# Patient Record
Sex: Male | Born: 1970 | Race: White | Hispanic: No | Marital: Single
Health system: Southern US, Community
[De-identification: ages and names within clinical notes are randomized; demographics above are authoritative.]

---

## 2018-04-28 ENCOUNTER — Emergency Department (HOSPITAL_COMMUNITY): Payer: Self-pay

## 2018-04-28 ENCOUNTER — Emergency Department (HOSPITAL_COMMUNITY)
Admission: EM | Admit: 2018-04-28 | Discharge: 2018-04-28 | Disposition: A | Discharge status: Home / Self Care | Payer: Self-pay | Attending: Emergency Medicine | Admitting: Emergency Medicine

## 2018-04-28 ENCOUNTER — Encounter (HOSPITAL_COMMUNITY): Payer: Self-pay

## 2018-04-28 ENCOUNTER — Other Ambulatory Visit: Payer: Self-pay

## 2018-04-28 DIAGNOSIS — Y999 Unspecified external cause status: Secondary | ICD-10-CM | POA: Insufficient documentation

## 2018-04-28 DIAGNOSIS — W268XXA Contact with other sharp object(s), not elsewhere classified, initial encounter: Secondary | ICD-10-CM | POA: Insufficient documentation

## 2018-04-28 DIAGNOSIS — S66323A Laceration of extensor muscle, fascia and tendon of left middle finger at wrist and hand level, initial encounter: Secondary | ICD-10-CM | POA: Insufficient documentation

## 2018-04-28 DIAGNOSIS — Y9389 Activity, other specified: Secondary | ICD-10-CM | POA: Insufficient documentation

## 2018-04-28 DIAGNOSIS — Y929 Unspecified place or not applicable: Secondary | ICD-10-CM | POA: Insufficient documentation

## 2018-04-28 MED ORDER — ONDANSETRON HCL 4 MG PO TABS: 4.0000 mg | ORAL_TABLET | Freq: Four times a day (QID) | ORAL | 0 refills | Status: DC

## 2018-04-28 MED ORDER — HYDROCODONE-ACETAMINOPHEN 5-325 MG PO TABS: 1.0000 | ORAL_TABLET | ORAL | 0 refills | Status: DC | PRN

## 2018-04-28 MED ORDER — POVIDONE-IODINE 10 % EX SOLN
CUTANEOUS | Status: AC
  Administered 2018-04-28: 19:00:00
  Filled 2018-04-28: qty 15

## 2018-04-28 MED ORDER — LIDOCAINE HCL (PF) 1 % IJ SOLN
5.0000 mL | Freq: Once | INTRAMUSCULAR | Status: AC
  Administered 2018-04-28: 5 mL
  Filled 2018-04-28: qty 6

## 2018-04-28 NOTE — Discharge Instructions (Signed)
You have a laceration of the left middle finger area that involves a tendon which helps with movement of your finger.  The laceration has been closed loosely, and the wound has been washed out.  Please see Dr. [name], or the hand specialist of your choice concerning this tendon laceration.  Please keep the splint on until you are seen by hand specialist.  Please keep the splint clean and dry.  Use Tylenol every 4 hours, or ibuprofen every 6 hours for pain and discomfort.  You may use Norco for more severe pain.  You also have a prescription for Zofran in the event that the narcotic medication causes nausea/vomiting.

## 2018-04-28 NOTE — ED Provider Notes (Signed)
[facility] EMERGENCY DEPARTMENT Provider Note   CSN: [id number] Arrival date & time: 04/28/18  1634     History   Chief Complaint Chief Complaint  Patient presents with  . Laceration    HPI Aaron Webster is a 47 y.o. male.  Patient is a 47 year old male who presents to the emergency department with a laceration to the left hand.  The patient states that he was working on some sheet metal and he sustained a laceration to the back of his hand at the area of the middle finger.  Patient states that since that time he can bend his all of his fingers, but he cannot lift the middle finger.  The patient states that his tetanus is up-to-date.  The patient denies being on any anticoagulation medications.  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  He presents to the emergency department now for assistance with this issue.  No other lacerations reported.  The history is provided by the patient.  Laceration      History reviewed. No pertinent past medical history.  There are no active problems to display for this patient.   History reviewed. No pertinent surgical history.      Home Medications    Prior to Admission medications   Not on File    Family History No family history on file.  Social History Social History   Tobacco Use  . Smoking status: Never Smoker  . Smokeless tobacco: Never Used  Substance Use Topics  . Alcohol use: Never    Frequency: Never  . Drug use: Never     Allergies   Codeine   Review of Systems Review of Systems  Constitutional: Negative for activity change.       All ROS Neg except as noted in HPI  HENT: Negative for nosebleeds.   Eyes: Negative for photophobia and discharge.  Respiratory: Negative for cough, shortness of breath and wheezing.   Cardiovascular: Negative for chest pain and palpitations.  Gastrointestinal: Negative for abdominal pain and blood in stool.  Genitourinary: Negative for dysuria, frequency and hematuria.    Musculoskeletal: Negative for arthralgias, back pain and neck pain.  Skin: Negative.   Neurological: Negative for dizziness, seizures and speech difficulty.  Psychiatric/Behavioral: Negative for confusion and hallucinations.     Physical Exam Updated Vital Signs BP 139/85 (BP Location: Right Arm)   Pulse (!) 55   Temp 98 F (36.7 C) (Oral)   Resp 17   Ht  (1.905 m)   Wt 97.5 kg (215 lb)   SpO2 99%   BMI 26.87 kg/m   Physical Exam  Constitutional: He is oriented to person, place, and time. He appears well-developed and well-nourished.  Non-toxic appearance.  HENT:  Head: Normocephalic.  Right Ear: Tympanic membrane and external ear normal.  Left Ear: Tympanic membrane and external ear normal.  Eyes: Pupils are equal, round, and reactive to light. EOM and lids are normal.  Neck: Normal range of motion. Neck supple. Carotid bruit is not present.  Cardiovascular: Normal rate, regular rhythm, normal heart sounds, intact distal pulses and normal pulses.  Pulmonary/Chest: Breath sounds normal. No respiratory distress.  Abdominal: Soft. Bowel sounds are normal. There is no tenderness. There is no guarding.  Musculoskeletal:       Left hand: He exhibits decreased range of motion, tenderness and laceration. Normal sensation noted. Decreased strength noted.       Hands: Pt cannot extend the left middle finger.  1.2 cm laceration  of the dorsum of the MP joint of the left middle finger.  Capillary refill is less than 2 seconds.  Radial pulses 2+.  No temperature changes of any of the fingers of the left upper extremity.  No injury to the right upper extremity.  Lymphadenopathy:       Head (right side): No submandibular adenopathy present.       Head (left side): No submandibular adenopathy present.    He has no cervical adenopathy.  Neurological: He is alert and oriented to person, place, and time. He has normal strength. No cranial nerve deficit or sensory deficit.  Skin: Skin is  warm and dry.  Psychiatric: He has a normal mood and affect. His speech is normal.  Nursing note and vitals reviewed.    ED Treatments / Results  Labs (all labs ordered are listed, but only abnormal results are displayed) Labs Reviewed - No data to display  EKG None  Radiology Dg Hand Complete Left  Result Date: 04/28/2018 CLINICAL DATA:  Trauma with laceration of middle finger. EXAM: LEFT HAND - COMPLETE 3+ VIEW COMPARISON:  None. FINDINGS: No acute fracture or dislocation. Soft tissue injury about the dorsum of the hand at the level of the metacarpals. No radiopaque foreign object. Soft tissue swelling about the level of the metacarpal phalangeal joints. IMPRESSION: Soft tissue injury, without acute osseous abnormality. Electronically Signed   By: Jeronimo Greaves M.D.   On: 04/28/2018 17:28    Procedures .Marland KitchenLaceration Repair Date/Time: 04/28/2018 6:58 PM Performed by: Ivery Quale, PA-C Authorized by: Ivery Quale, PA-C   Consent:    Consent obtained:  Verbal   Consent given by:  Patient   Risks discussed:  Infection, pain and poor wound healing Anesthesia (see MAR for exact dosages):    Anesthesia method:  Local infiltration   Local anesthetic:  Lidocaine 1% w/o epi Laceration details:    Location:  Hand   Hand location:  L hand, dorsum   Length (cm):  1.2 Repair type:    Repair type:  Simple Pre-procedure details:    Preparation:  Patient was prepped and draped in usual sterile fashion Exploration:    Hemostasis achieved with:  Direct pressure   Wound exploration: wound explored through full range of motion and entire depth of wound probed and visualized     Wound extent: tendon damage     Wound extent: no foreign bodies/material noted and no underlying fracture noted     Tendon damage location:  Upper extremity   Upper extremity tendon damage location:  Finger extensor   Tendon damage extent:  Complete transection   Tendon repair plan:  Refer for  evaluation Treatment:    Area cleansed with:  Betadine   Amount of cleaning:  Standard   Irrigation solution:  Sterile saline Skin repair:    Repair method:  Sutures   Suture size:  4-0   Suture material:  Nylon   Suture technique:  Simple interrupted   Number of sutures:  3 Approximation:    Approximation:  Loose Post-procedure details:    Dressing:  Splint for protection, antibiotic ointment and non-adherent dressing   Patient tolerance of procedure:  Tolerated well, no immediate complications .Splint Application Date/Time: 04/28/2018 7:05 PM Performed by: Ivery Quale, PA-C Authorized by: Ivery Quale, PA-C   Consent:    Consent obtained:  Verbal   Consent given by:  Patient   Risks discussed:  Numbness, pain and swelling Universal protocol:    Procedure explained and questions  answered to patient or proxy's satisfaction: yes     Test results available and properly labeled: yes     Immediately prior to procedure a time out was called: yes     Patient identity confirmed:  Arm band Pre-procedure details:    Sensation:  Normal   Skin color:  Normal Procedure details:    Laterality:  Left   Location:  Hand   Hand:  L hand   Splint type:  Wrist   Supplies:  Ortho-Glass Post-procedure details:    Pain:  Unchanged   Sensation:  Normal   Skin color:  Normal   Patient tolerance of procedure:  Tolerated well, no immediate complications   (including critical care time)  Medications Ordered in ED Medications  povidone-iodine (BETADINE) 10 % external solution (has no administration in time range)  lidocaine (PF) (XYLOCAINE) 1 % injection 5 mL (has no administration in time range)     Initial Impression / Assessment and Plan / ED Course  I have reviewed the triage vital signs and the nursing notes.  Pertinent labs & imaging results that were available during my care of the patient were reviewed by me and considered in my medical decision making (see chart for  details).      Final Clinical Impressions(s) / ED Diagnoses MDM  Patient noted to have a laceration to the dorsum of the left hand at the MP joint.  Further examination reveals a tendon injury, with inability of the patient to extend the middle finger of the left hand.  The wound was closed loosely.  A splint was applied.  The patient was given medication to assist with pain.  Patient is referred to Dr. Omer Jack surgery.  Patient was advised to return if any signs of advancing infection or other problems before he is seen by Dr. [name] or a member of his team.  Patient is in agreement with this plan.   Final diagnoses:  Laceration of extensor muscle, fascia and tendon of left middle finger at wrist and hand level, initial encounter    ED Discharge Orders        Ordered    HYDROcodone-acetaminophen (NORCO/VICODIN) 5-325 MG tablet  Every 4 hours PRN     04/28/18 1850    ondansetron (ZOFRAN) 4 MG tablet  Every 6 hours     04/28/18 1850       Ivery Quale, PA-C 04/28/18 1910    Loren Racer, MD 05/01/18 [phone]

## 2018-04-28 NOTE — ED Triage Notes (Signed)
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 Needs tetanus shot.

## 2018-05-01 ENCOUNTER — Ambulatory Visit: Admit: 2018-05-01 | Payer: Self-pay | Admitting: Orthopedic Surgery

## 2018-05-01 SURGERY — IRRIGATION AND DEBRIDEMENT EXTREMITY
Anesthesia: General | Laterality: Left

## 2019-12-11 HISTORY — PX: HAND SURGERY: SHX662

## 2021-05-10 HISTORY — PX: LUMBAR FUSION: SHX111

## 2021-06-02 ENCOUNTER — Emergency Department (HOSPITAL_COMMUNITY): Payer: Commercial Managed Care - PPO

## 2021-06-02 ENCOUNTER — Inpatient Hospital Stay (HOSPITAL_COMMUNITY)
Admission: EM | Admit: 2021-06-02 | Discharge: 2021-06-06 | DRG: 518 | Disposition: A | Discharge status: Home / Self Care | Payer: Commercial Managed Care - PPO | Attending: General Surgery | Admitting: General Surgery

## 2021-06-02 ENCOUNTER — Inpatient Hospital Stay (HOSPITAL_COMMUNITY): Payer: Commercial Managed Care - PPO

## 2021-06-02 ENCOUNTER — Emergency Department (HOSPITAL_COMMUNITY): Payer: Commercial Managed Care - PPO | Admitting: Anesthesiology

## 2021-06-02 ENCOUNTER — Encounter (HOSPITAL_COMMUNITY): Admission: EM | Disposition: A | Discharge status: Home / Self Care | Payer: Self-pay | Source: Home / Self Care

## 2021-06-02 DIAGNOSIS — R578 Other shock: Secondary | ICD-10-CM | POA: Diagnosis present

## 2021-06-02 DIAGNOSIS — S32031A Stable burst fracture of third lumbar vertebra, initial encounter for closed fracture: Secondary | ICD-10-CM | POA: Diagnosis present

## 2021-06-02 DIAGNOSIS — Z885 Allergy status to narcotic agent status: Secondary | ICD-10-CM

## 2021-06-02 DIAGNOSIS — K661 Hemoperitoneum: Secondary | ICD-10-CM | POA: Diagnosis present

## 2021-06-02 DIAGNOSIS — S9304XA Dislocation of right ankle joint, initial encounter: Secondary | ICD-10-CM | POA: Diagnosis present

## 2021-06-02 DIAGNOSIS — Z23 Encounter for immunization: Secondary | ICD-10-CM | POA: Diagnosis not present

## 2021-06-02 DIAGNOSIS — S82871B Displaced pilon fracture of right tibia, initial encounter for open fracture type I or II: Secondary | ICD-10-CM | POA: Diagnosis present

## 2021-06-02 DIAGNOSIS — J9690 Respiratory failure, unspecified, unspecified whether with hypoxia or hypercapnia: Secondary | ICD-10-CM

## 2021-06-02 DIAGNOSIS — S32019A Unspecified fracture of first lumbar vertebra, initial encounter for closed fracture: Secondary | ICD-10-CM | POA: Diagnosis present

## 2021-06-02 DIAGNOSIS — S36438A Laceration of other part of small intestine, initial encounter: Secondary | ICD-10-CM | POA: Diagnosis present

## 2021-06-02 DIAGNOSIS — Z20822 Contact with and (suspected) exposure to covid-19: Secondary | ICD-10-CM | POA: Diagnosis present

## 2021-06-02 DIAGNOSIS — D62 Acute posthemorrhagic anemia: Secondary | ICD-10-CM | POA: Diagnosis not present

## 2021-06-02 DIAGNOSIS — Z419 Encounter for procedure for purposes other than remedying health state, unspecified: Secondary | ICD-10-CM

## 2021-06-02 DIAGNOSIS — S32029A Unspecified fracture of second lumbar vertebra, initial encounter for closed fracture: Secondary | ICD-10-CM | POA: Diagnosis present

## 2021-06-02 DIAGNOSIS — Z9049 Acquired absence of other specified parts of digestive tract: Secondary | ICD-10-CM

## 2021-06-02 DIAGNOSIS — T1490XA Injury, unspecified, initial encounter: Secondary | ICD-10-CM

## 2021-06-02 DIAGNOSIS — S81012A Laceration without foreign body, left knee, initial encounter: Secondary | ICD-10-CM | POA: Diagnosis present

## 2021-06-02 DIAGNOSIS — T148XXA Other injury of unspecified body region, initial encounter: Secondary | ICD-10-CM

## 2021-06-02 DIAGNOSIS — K658 Other peritonitis: Secondary | ICD-10-CM | POA: Diagnosis present

## 2021-06-02 DIAGNOSIS — N179 Acute kidney failure, unspecified: Secondary | ICD-10-CM | POA: Diagnosis present

## 2021-06-02 DIAGNOSIS — S32049A Unspecified fracture of fourth lumbar vertebra, initial encounter for closed fracture: Secondary | ICD-10-CM | POA: Diagnosis present

## 2021-06-02 HISTORY — PX: LAPAROTOMY: SHX154

## 2021-06-02 HISTORY — PX: EXTERNAL FIXATION LEG: SHX1549

## 2021-06-02 HISTORY — PX: I & D EXTREMITY: SHX5045

## 2021-06-02 LAB — POCT I-STAT 7, (LYTES, BLD GAS, ICA,H+H)
Acid-Base Excess: 0 mmol/L (ref 0.0–2.0)
Acid-Base Excess: 3 mmol/L — ABNORMAL HIGH (ref 0.0–2.0)
Acid-base deficit: 2 mmol/L (ref 0.0–2.0)
Bicarbonate: 24.3 mmol/L (ref 20.0–28.0)
Bicarbonate: 25.2 mmol/L (ref 20.0–28.0)
Bicarbonate: 28.3 mmol/L — ABNORMAL HIGH (ref 20.0–28.0)
Calcium, Ion: 1.07 mmol/L — ABNORMAL LOW (ref 1.15–1.40)
Calcium, Ion: 1.15 mmol/L (ref 1.15–1.40)
Calcium, Ion: 1.17 mmol/L (ref 1.15–1.40)
HCT: 27 % — ABNORMAL LOW (ref 39.0–52.0)
HCT: 29 % — ABNORMAL LOW (ref 39.0–52.0)
HCT: 30 % — ABNORMAL LOW (ref 39.0–52.0)
Hemoglobin: 10.2 g/dL — ABNORMAL LOW (ref 13.0–17.0)
Hemoglobin: 9.2 g/dL — ABNORMAL LOW (ref 13.0–17.0)
Hemoglobin: 9.9 g/dL — ABNORMAL LOW (ref 13.0–17.0)
O2 Saturation: 100 %
O2 Saturation: 100 %
O2 Saturation: 100 %
Patient temperature: 35.6
Patient temperature: 97.7
Potassium: 3.2 mmol/L — ABNORMAL LOW (ref 3.5–5.1)
Potassium: 3.6 mmol/L (ref 3.5–5.1)
Potassium: 3.7 mmol/L (ref 3.5–5.1)
Sodium: 139 mmol/L (ref 135–145)
Sodium: 141 mmol/L (ref 135–145)
Sodium: 141 mmol/L (ref 135–145)
TCO2: 26 mmol/L (ref 22–32)
TCO2: 26 mmol/L (ref 22–32)
TCO2: 30 mmol/L (ref 22–32)
pCO2 arterial: 42.3 mmHg (ref 32.0–48.0)
pCO2 arterial: 43.3 mmHg (ref 32.0–48.0)
pCO2 arterial: 44.5 mmHg (ref 32.0–48.0)
pH, Arterial: 7.35 (ref 7.350–7.450)
pH, Arterial: 7.382 (ref 7.350–7.450)
pH, Arterial: 7.41 (ref 7.350–7.450)
pO2, Arterial: 235 mmHg — ABNORMAL HIGH (ref 83.0–108.0)
pO2, Arterial: 248 mmHg — ABNORMAL HIGH (ref 83.0–108.0)
pO2, Arterial: 535 mmHg — ABNORMAL HIGH (ref 83.0–108.0)

## 2021-06-02 LAB — COMPREHENSIVE METABOLIC PANEL
ALT: 57 U/L — ABNORMAL HIGH (ref 0–44)
AST: 86 U/L — ABNORMAL HIGH (ref 15–41)
Albumin: 4.3 g/dL (ref 3.5–5.0)
Alkaline Phosphatase: 57 U/L (ref 38–126)
Anion gap: 14 (ref 5–15)
BUN: 16 mg/dL (ref 6–20)
CO2: 21 mmol/L — ABNORMAL LOW (ref 22–32)
Calcium: 9.3 mg/dL (ref 8.9–10.3)
Chloride: 104 mmol/L (ref 98–111)
Creatinine, Ser: 2.01 mg/dL — ABNORMAL HIGH (ref 0.61–1.24)
GFR, Estimated: 39 mL/min — ABNORMAL LOW (ref >60)
Glucose, Bld: 189 mg/dL — ABNORMAL HIGH (ref 70–99)
Potassium: 3.2 mmol/L — ABNORMAL LOW (ref 3.5–5.1)
Sodium: 139 mmol/L (ref 135–145)
Total Bilirubin: 0.6 mg/dL (ref 0.3–1.2)
Total Protein: 6.9 g/dL (ref 6.5–8.1)

## 2021-06-02 LAB — I-STAT CHEM 8, ED
BUN: 18 mg/dL (ref 6–20)
Calcium, Ion: 1.11 mmol/L — ABNORMAL LOW (ref 1.15–1.40)
Chloride: 106 mmol/L (ref 98–111)
Creatinine, Ser: 1.9 mg/dL — ABNORMAL HIGH (ref 0.61–1.24)
Glucose, Bld: 184 mg/dL — ABNORMAL HIGH (ref 70–99)
HCT: 43 % (ref 39.0–52.0)
Hemoglobin: 14.6 g/dL (ref 13.0–17.0)
Potassium: 3.3 mmol/L — ABNORMAL LOW (ref 3.5–5.1)
Sodium: 140 mmol/L (ref 135–145)
TCO2: 21 mmol/L — ABNORMAL LOW (ref 22–32)

## 2021-06-02 LAB — ABO/RH: ABO/RH(D): B POS

## 2021-06-02 LAB — LACTIC ACID, PLASMA: Lactic Acid, Venous: 3.6 mmol/L (ref 0.5–1.9)

## 2021-06-02 LAB — ETHANOL: Alcohol, Ethyl (B): <10 mg/dL (ref <10)

## 2021-06-02 LAB — CBC WITH DIFFERENTIAL/PLATELET
Abs Immature Granulocytes: 0.25 10*3/uL — ABNORMAL HIGH (ref 0.00–0.07)
Basophils Absolute: 0 10*3/uL (ref 0.0–0.1)
Basophils Relative: 0 %
Eosinophils Absolute: 0 10*3/uL (ref 0.0–0.5)
Eosinophils Relative: 0 %
HCT: 41.2 % (ref 39.0–52.0)
Hemoglobin: 13.4 g/dL (ref 13.0–17.0)
Immature Granulocytes: 2 %
Lymphocytes Relative: 12 %
Lymphs Abs: 1.8 10*3/uL (ref 0.7–4.0)
MCH: 29.3 pg (ref 26.0–34.0)
MCHC: 32.5 g/dL (ref 30.0–36.0)
MCV: 90 fL (ref 80.0–100.0)
Monocytes Absolute: 0.5 10*3/uL (ref 0.1–1.0)
Monocytes Relative: 4 %
Neutro Abs: 12.1 10*3/uL — ABNORMAL HIGH (ref 1.7–7.7)
Neutrophils Relative %: 82 %
Platelets: 388 10*3/uL (ref 150–400)
RBC: 4.58 MIL/uL (ref 4.22–5.81)
RDW: 12.5 % (ref 11.5–15.5)
WBC: 14.8 10*3/uL — ABNORMAL HIGH (ref 4.0–10.5)
nRBC: 0 % (ref 0.0–0.2)

## 2021-06-02 LAB — PROTIME-INR
INR: 1.2 (ref 0.8–1.2)
Prothrombin Time: 14.8 seconds (ref 11.4–15.2)

## 2021-06-02 LAB — TRAUMA TEG PANEL
CFF Max Amplitude: 18.4 mm (ref 15–32)
Citrated Kaolin (R): 3.4 min — ABNORMAL LOW (ref 4.6–9.1)
Citrated Rapid TEG (MA): 61.6 mm (ref 52–70)
Lysis at 30 Minutes: 1.1 % (ref 0.0–2.6)

## 2021-06-02 LAB — APTT: aPTT: 25 seconds (ref 24–36)

## 2021-06-02 LAB — RESP PANEL BY RT-PCR (FLU A&B, COVID) ARPGX2
Influenza A by PCR: NEGATIVE
Influenza B by PCR: NEGATIVE
SARS Coronavirus 2 by RT PCR: NEGATIVE

## 2021-06-02 LAB — PREPARE RBC (CROSSMATCH)

## 2021-06-02 SURGERY — LAPAROTOMY, EXPLORATORY
Anesthesia: General | Site: Ankle | Laterality: Right

## 2021-06-02 MED ORDER — TRANEXAMIC ACID-NACL 1000-0.7 MG/100ML-% IV SOLN
1000.0000 mg | INTRAVENOUS | Status: AC
  Administered 2021-06-02: 1000 mg via INTRAVENOUS
  Filled 2021-06-02: qty 100

## 2021-06-02 MED ORDER — PHENYLEPHRINE 40 MCG/ML (10ML) SYRINGE FOR IV PUSH (FOR BLOOD PRESSURE SUPPORT)
PREFILLED_SYRINGE | INTRAVENOUS | Status: AC
  Filled 2021-06-02: qty 40

## 2021-06-02 MED ORDER — CEFAZOLIN SODIUM-DEXTROSE 2-4 GM/100ML-% IV SOLN
2.0000 g | Freq: Once | INTRAVENOUS | Status: AC
  Administered 2021-06-02: 2 g via INTRAVENOUS

## 2021-06-02 MED ORDER — MIDAZOLAM HCL 5 MG/5ML IJ SOLN
INTRAMUSCULAR | Status: DC | PRN
  Administered 2021-06-02 (×2): 1 mg via INTRAVENOUS

## 2021-06-02 MED ORDER — POLYETHYLENE GLYCOL 3350 17 G PO PACK: 17.0000 g | PACK | Freq: Every day | ORAL | Status: DC

## 2021-06-02 MED ORDER — FENTANYL CITRATE (PF) 250 MCG/5ML IJ SOLN
INTRAMUSCULAR | Status: AC
  Filled 2021-06-02: qty 5

## 2021-06-02 MED ORDER — ETOMIDATE 2 MG/ML IV SOLN
INTRAVENOUS | Status: AC
  Filled 2021-06-02: qty 10

## 2021-06-02 MED ORDER — ONDANSETRON HCL 4 MG/2ML IJ SOLN: 4.0000 mg | Freq: Four times a day (QID) | INTRAMUSCULAR | Status: DC | PRN

## 2021-06-02 MED ORDER — FENTANYL 2500MCG IN NS 250ML (10MCG/ML) PREMIX INFUSION
50.0000 ug/h | INTRAVENOUS | Status: DC
  Administered 2021-06-02 – 2021-06-03 (×2): 100 ug/h via INTRAVENOUS
  Filled 2021-06-02 (×2): qty 250

## 2021-06-02 MED ORDER — ONDANSETRON HCL 4 MG/2ML IJ SOLN
INTRAMUSCULAR | Status: AC
  Filled 2021-06-02: qty 4

## 2021-06-02 MED ORDER — PIPERACILLIN-TAZOBACTAM 3.375 G IVPB
3.3750 g | INTRAVENOUS | Status: DC
  Filled 2021-06-02: qty 50

## 2021-06-02 MED ORDER — SODIUM CHLORIDE 0.9% IV SOLUTION: Freq: Once | INTRAVENOUS | Status: DC

## 2021-06-02 MED ORDER — DOCUSATE SODIUM 50 MG/5ML PO LIQD: 100.0000 mg | Freq: Two times a day (BID) | ORAL | Status: DC

## 2021-06-02 MED ORDER — TETANUS-DIPHTH-ACELL PERTUSSIS 5-2.5-18.5 LF-MCG/0.5 IM SUSY
0.5000 mL | PREFILLED_SYRINGE | Freq: Once | INTRAMUSCULAR | Status: AC
  Administered 2021-06-02: 0.5 mL via INTRAMUSCULAR

## 2021-06-02 MED ORDER — LACTATED RINGERS IV SOLN: INTRAVENOUS | Status: DC | PRN

## 2021-06-02 MED ORDER — ONDANSETRON HCL 4 MG/2ML IJ SOLN: 4.0000 mg | Freq: Once | INTRAMUSCULAR | Status: DC

## 2021-06-02 MED ORDER — ONDANSETRON HCL 4 MG PO TABS: 4.0000 mg | ORAL_TABLET | Freq: Four times a day (QID) | ORAL | Status: DC | PRN

## 2021-06-02 MED ORDER — PROPOFOL 10 MG/ML IV BOLUS
INTRAVENOUS | Status: DC | PRN
  Administered 2021-06-02: 120 mg via INTRAVENOUS

## 2021-06-02 MED ORDER — DOCUSATE SODIUM 100 MG PO CAPS
100.0000 mg | ORAL_CAPSULE | Freq: Two times a day (BID) | ORAL | Status: DC
  Administered 2021-06-03 – 2021-06-06 (×6): 100 mg via ORAL
  Filled 2021-06-02 (×7): qty 1

## 2021-06-02 MED ORDER — METOCLOPRAMIDE HCL 5 MG PO TABS
5.0000 mg | ORAL_TABLET | Freq: Three times a day (TID) | ORAL | Status: DC | PRN
  Filled 2021-06-02: qty 2

## 2021-06-02 MED ORDER — DOCUSATE SODIUM 100 MG PO CAPS: 100.0000 mg | ORAL_CAPSULE | Freq: Two times a day (BID) | ORAL | Status: DC

## 2021-06-02 MED ORDER — PROPOFOL 1000 MG/100ML IV EMUL
0.0000 ug/kg/min | INTRAVENOUS | Status: DC
  Administered 2021-06-02: 40 ug/kg/min via INTRAVENOUS
  Administered 2021-06-03: 20 ug/kg/min via INTRAVENOUS
  Administered 2021-06-03: 50 ug/kg/min via INTRAVENOUS
  Administered 2021-06-03: 40 ug/kg/min via INTRAVENOUS
  Filled 2021-06-02 (×3): qty 100

## 2021-06-02 MED ORDER — SUCCINYLCHOLINE CHLORIDE 20 MG/ML IJ SOLN
INTRAMUSCULAR | Status: DC | PRN
  Administered 2021-06-02: 120 mg via INTRAVENOUS

## 2021-06-02 MED ORDER — PANTOPRAZOLE SODIUM 40 MG PO TBEC
40.0000 mg | DELAYED_RELEASE_TABLET | Freq: Every day | ORAL | Status: DC
  Administered 2021-06-05 – 2021-06-06 (×2): 40 mg via ORAL
  Filled 2021-06-02: qty 2
  Filled 2021-06-02: qty 1

## 2021-06-02 MED ORDER — LACTATED RINGERS IV SOLN: INTRAVENOUS | Status: DC

## 2021-06-02 MED ORDER — EPHEDRINE 5 MG/ML INJ
INTRAVENOUS | Status: AC
  Filled 2021-06-02: qty 30

## 2021-06-02 MED ORDER — METOCLOPRAMIDE HCL 5 MG/ML IJ SOLN
5.0000 mg | Freq: Three times a day (TID) | INTRAMUSCULAR | Status: DC | PRN
Start: 2021-06-02 — End: 2021-06-06

## 2021-06-02 MED ORDER — SUCCINYLCHOLINE CHLORIDE 200 MG/10ML IV SOSY
PREFILLED_SYRINGE | INTRAVENOUS | Status: AC
  Filled 2021-06-02: qty 30

## 2021-06-02 MED ORDER — POLYETHYLENE GLYCOL 3350 17 G PO PACK: 17.0000 g | PACK | Freq: Every day | ORAL | Status: DC | PRN

## 2021-06-02 MED ORDER — ROCURONIUM BROMIDE 10 MG/ML (PF) SYRINGE
PREFILLED_SYRINGE | INTRAVENOUS | Status: DC | PRN
  Administered 2021-06-02 (×2): 50 mg via INTRAVENOUS
  Administered 2021-06-02: 55 mg via INTRAVENOUS

## 2021-06-02 MED ORDER — MIDAZOLAM HCL 2 MG/2ML IJ SOLN
INTRAMUSCULAR | Status: AC
  Filled 2021-06-02: qty 2

## 2021-06-02 MED ORDER — FENTANYL BOLUS VIA INFUSION
50.0000 ug | INTRAVENOUS | Status: DC | PRN
  Filled 2021-06-02: qty 100

## 2021-06-02 MED ORDER — PANTOPRAZOLE SODIUM 40 MG IV SOLR
40.0000 mg | Freq: Every day | INTRAVENOUS | Status: DC
  Administered 2021-06-03 – 2021-06-04 (×2): 40 mg via INTRAVENOUS
  Filled 2021-06-02 (×2): qty 40

## 2021-06-02 MED ORDER — ONDANSETRON HCL 4 MG/2ML IJ SOLN
INTRAMUSCULAR | Status: AC
  Filled 2021-06-02: qty 2

## 2021-06-02 MED ORDER — ROCURONIUM BROMIDE 10 MG/ML (PF) SYRINGE
PREFILLED_SYRINGE | INTRAVENOUS | Status: AC
  Filled 2021-06-02: qty 30

## 2021-06-02 MED ORDER — PHENYLEPHRINE 40 MCG/ML (10ML) SYRINGE FOR IV PUSH (FOR BLOOD PRESSURE SUPPORT)
PREFILLED_SYRINGE | INTRAVENOUS | Status: DC | PRN
  Administered 2021-06-02: 80 ug via INTRAVENOUS
  Administered 2021-06-02 (×3): 160 ug via INTRAVENOUS

## 2021-06-02 MED ORDER — FENTANYL CITRATE (PF) 100 MCG/2ML IJ SOLN: 50.0000 ug | Freq: Once | INTRAMUSCULAR | Status: DC

## 2021-06-02 MED ORDER — FENTANYL CITRATE (PF) 250 MCG/5ML IJ SOLN
INTRAMUSCULAR | Status: DC | PRN
  Administered 2021-06-02 (×3): 100 ug via INTRAVENOUS
  Administered 2021-06-02 (×9): 50 ug via INTRAVENOUS

## 2021-06-02 MED ORDER — 0.9 % SODIUM CHLORIDE (POUR BTL) OPTIME
TOPICAL | Status: DC | PRN
  Administered 2021-06-02: 4000 mL
  Administered 2021-06-02: 2000 mL

## 2021-06-02 MED ORDER — PIPERACILLIN-TAZOBACTAM 3.375 G IVPB
3.3750 g | Freq: Three times a day (TID) | INTRAVENOUS | Status: DC
  Administered 2021-06-03 – 2021-06-06 (×10): 3.375 g via INTRAVENOUS
  Filled 2021-06-02 (×11): qty 50

## 2021-06-02 MED ORDER — SODIUM CHLORIDE 0.9 % IV SOLN: INTRAVENOUS | Status: DC | PRN

## 2021-06-02 MED ORDER — CALCIUM CHLORIDE 10 % IV SOLN
INTRAVENOUS | Status: DC | PRN
  Administered 2021-06-02 (×2): 500 mg via INTRAVENOUS

## 2021-06-02 SURGICAL SUPPLY — 84 items
BAR GLASS FIBER EXFX 11X150 (EXFIX) ×8 IMPLANT
BAR GLASS FIBER EXFX 11X400 (EXFIX) ×8 IMPLANT
BIT DRILL 3.2 XTRAFIX BLUE (BIT) ×4 IMPLANT
BIT DRILL CANN MED FLUTE 4.0 (BIT) ×2 IMPLANT
BLADE CLIPPER SURG (BLADE) IMPLANT
BNDG ELASTIC 4X5.8 VLCR STR LF (GAUZE/BANDAGES/DRESSINGS) ×4 IMPLANT
BNDG ELASTIC 6X5.8 VLCR STR LF (GAUZE/BANDAGES/DRESSINGS) ×4 IMPLANT
BNDG GAUZE ELAST 4 BULKY (GAUZE/BANDAGES/DRESSINGS) ×24 IMPLANT
BRUSH SCRUB EZ PLAIN DRY (MISCELLANEOUS) ×8 IMPLANT
CANISTER SUCT 3000ML PPV (MISCELLANEOUS) ×4 IMPLANT
CAP PROTECTIVE TRANSFX 4.5X5MM (EXFIX) ×4 IMPLANT
CHLORAPREP W/TINT 26 (MISCELLANEOUS) IMPLANT
CLAMP BLUE BAR TO BAR (EXFIX) ×8 IMPLANT
CLAMP BLUE BAR TO PIN (EXFIX) ×16 IMPLANT
COTTON STERILE ROLL (GAUZE/BANDAGES/DRESSINGS) ×4 IMPLANT
COVER SURGICAL LIGHT HANDLE (MISCELLANEOUS) ×16 IMPLANT
COVER WAND RF STERILE (DRAPES) IMPLANT
DRAPE BILATERAL LIMB T (DRAPES) ×4 IMPLANT
DRAPE C-ARM 42X72 X-RAY (DRAPES) ×4 IMPLANT
DRAPE C-ARMOR (DRAPES) ×4 IMPLANT
DRAPE LAPAROSCOPIC ABDOMINAL (DRAPES) ×4 IMPLANT
DRAPE U-SHAPE 47X51 STRL (DRAPES) ×4 IMPLANT
DRAPE WARM FLUID 44X44 (DRAPES) ×4 IMPLANT
DRILL CANN 4.0MM (BIT) ×4
DRSG ADAPTIC 3X8 NADH LF (GAUZE/BANDAGES/DRESSINGS) ×4 IMPLANT
DRSG EMULSION OIL 3X3 NADH (GAUZE/BANDAGES/DRESSINGS) ×4 IMPLANT
DRSG OPSITE POSTOP 4X10 (GAUZE/BANDAGES/DRESSINGS) IMPLANT
DRSG OPSITE POSTOP 4X8 (GAUZE/BANDAGES/DRESSINGS) IMPLANT
DRSG PAD ABDOMINAL 8X10 ST (GAUZE/BANDAGES/DRESSINGS) ×4 IMPLANT
ELECT BLADE 6.5 EXT (BLADE) IMPLANT
ELECT CAUTERY BLADE 6.4 (BLADE) ×4 IMPLANT
ELECT REM PT RETURN 9FT ADLT (ELECTROSURGICAL) ×8
ELECTRODE REM PT RTRN 9FT ADLT (ELECTROSURGICAL) ×4 IMPLANT
GAUZE SPONGE 4X4 12PLY STRL (GAUZE/BANDAGES/DRESSINGS) ×8 IMPLANT
GLOVE SRG 8 PF TXTR STRL LF DI (GLOVE) ×2 IMPLANT
GLOVE SURG ENC MOIS LTX SZ6.5 (GLOVE) ×12 IMPLANT
GLOVE SURG ENC MOIS LTX SZ7.5 (GLOVE) ×16 IMPLANT
GLOVE SURG ENC MOIS LTX SZ8 (GLOVE) ×4 IMPLANT
GLOVE SURG UNDER POLY LF SZ6.5 (GLOVE) ×4 IMPLANT
GLOVE SURG UNDER POLY LF SZ7.5 (GLOVE) ×4 IMPLANT
GLOVE SURG UNDER POLY LF SZ8 (GLOVE) ×2
GOWN STRL REUS W/ TWL LRG LVL3 (GOWN DISPOSABLE) ×6 IMPLANT
GOWN STRL REUS W/ TWL XL LVL3 (GOWN DISPOSABLE) ×2 IMPLANT
GOWN STRL REUS W/TWL LRG LVL3 (GOWN DISPOSABLE) ×6
GOWN STRL REUS W/TWL XL LVL3 (GOWN DISPOSABLE) ×2
HANDLE SUCTION POOLE (INSTRUMENTS) ×4 IMPLANT
KIT BASIN OR (CUSTOM PROCEDURE TRAY) ×8 IMPLANT
KIT TURNOVER KIT B (KITS) ×8 IMPLANT
LIGASURE IMPACT 36 18CM CVD LR (INSTRUMENTS) ×4 IMPLANT
MANIFOLD NEPTUNE II (INSTRUMENTS) ×4 IMPLANT
NS IRRIG 1000ML POUR BTL (IV SOLUTION) ×12 IMPLANT
PACK GENERAL/GYN (CUSTOM PROCEDURE TRAY) ×4 IMPLANT
PACK LAPAROSCOPY BASIN (CUSTOM PROCEDURE TRAY) ×4 IMPLANT
PACK TOTAL JOINT (CUSTOM PROCEDURE TRAY) ×8 IMPLANT
PAD ARMBOARD 7.5X6 YLW CONV (MISCELLANEOUS) ×12 IMPLANT
PADDING CAST COTTON 6X4 STRL (CAST SUPPLIES) ×12 IMPLANT
PENCIL SMOKE EVACUATOR (MISCELLANEOUS) ×4 IMPLANT
PIN 4X100X20MM EXFIX LG BLUNT (EXFIX) ×8 IMPLANT
PIN CLAMP 2BAR 75MM BLUE (EXFIX) ×4 IMPLANT
PIN HALF 5X160X35 BLUNT TIP (EXFIX) ×8 IMPLANT
PIN TRANSFIXING 5.0 (EXFIX) ×4 IMPLANT
RELOAD PROXIMATE 75MM BLUE (ENDOMECHANICALS) ×8 IMPLANT
SPECIMEN JAR LARGE (MISCELLANEOUS) ×4 IMPLANT
SPONGE LAP 18X18 RF (DISPOSABLE) ×28 IMPLANT
STAPLER GUN LINEAR PROX 60 (STAPLE) ×4 IMPLANT
STAPLER PROXIMATE 75MM BLUE (STAPLE) ×4 IMPLANT
STAPLER VISISTAT 35W (STAPLE) ×4 IMPLANT
SUCTION POOLE HANDLE (INSTRUMENTS) ×8
SUT ETHILON 3 0 PS 1 (SUTURE) ×4 IMPLANT
SUT MNCRL AB 3-0 PS2 18 (SUTURE) ×4 IMPLANT
SUT MON AB 2-0 CT1 36 (SUTURE) ×8 IMPLANT
SUT PDS AB 1 TP1 96 (SUTURE) ×8 IMPLANT
SUT SILK 2 0 SH CR/8 (SUTURE) ×4 IMPLANT
SUT SILK 2 0 TIES 10X30 (SUTURE) ×4 IMPLANT
SUT SILK 2 0SH CR/8 30 (SUTURE) ×4 IMPLANT
SUT SILK 3 0 SH CR/8 (SUTURE) ×4 IMPLANT
SUT SILK 3 0 TIES 10X30 (SUTURE) ×4 IMPLANT
SUT SILK 3 0SH CR/8 30 (SUTURE) ×12 IMPLANT
TOWEL GREEN STERILE (TOWEL DISPOSABLE) ×12 IMPLANT
TOWEL GREEN STERILE FF (TOWEL DISPOSABLE) ×8 IMPLANT
TRAY FOLEY MTR SLVR 16FR STAT (SET/KITS/TRAYS/PACK) IMPLANT
UNDERPAD 30X36 HEAVY ABSORB (UNDERPADS AND DIAPERS) ×4 IMPLANT
WATER STERILE IRR 1000ML POUR (IV SOLUTION) ×8 IMPLANT
YANKAUER SUCT BULB TIP NO VENT (SUCTIONS) IMPLANT
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
## 2021-06-02 NOTE — Anesthesia Procedure Notes (Signed)
Procedure Name: Intubation Date/Time: 06/02/2021 8:15 PM Performed by: Claudina Lick, CRNA Pre-anesthesia Checklist: Patient identified, Emergency Drugs available, Suction available and Patient being monitored Patient Re-evaluated:Patient Re-evaluated prior to induction Oxygen Delivery Method: Circle system utilized Preoxygenation: Pre-oxygenation with 100% oxygen (Cervical collar in situ) Induction Type: IV induction, Rapid sequence and Cricoid Pressure applied Laryngoscope Size: Glidescope and 4 Grade View: Grade I Tube type: Oral Tube size: 8.0 mm Number of attempts: 1 Airway Equipment and Method: Stylet and Oral airway Placement Confirmation: ETT inserted through vocal cords under direct vision, positive ETCO2 and breath sounds checked- equal and bilateral Secured at: 23 cm Tube secured with: Tape Dental Injury: Teeth and Oropharynx as per pre-operative assessment
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
## 2021-06-02 NOTE — Progress Notes (Signed)
   06/02/21 1850  Clinical Encounter Type  Visited With Patient not available  Visit Type Trauma  Referral From Nurse  Consult/Referral To Chaplain    Chaplain responded to page. The patient is being attended to by the medical staff. The patient's family is not present. Chaplain remains available. This note was prepared by Deneen Harts, M.Div..  For questions please contact by phone [phone].
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
## 2021-06-02 NOTE — ED Notes (Signed)
Positive FAST at bedside per Dr. Wynona Neat.
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
## 2021-06-02 NOTE — Anesthesia Postprocedure Evaluation (Signed)
Anesthesia Post Note  Patient: Aaron Webster  Procedure(s) Performed: EXPLORATORY LAPAROTOMY (Abdomen) EXTERNAL FIXATION LEG (Right: Ankle) IRRIGATION AND DEBRIDEMENT EXTREMITY (Right: Ankle)     Patient location during evaluation: SICU Anesthesia Type: General Level of consciousness: sedated Pain management: pain level controlled Vital Signs Assessment: post-procedure vital signs reviewed and stable Respiratory status: patient remains intubated per anesthesia plan Cardiovascular status: stable and tachycardic Postop Assessment: no apparent nausea or vomiting Anesthetic complications: no   No notable events documented.  Last Vitals:  Vitals:   06/02/21 1916 06/02/21 2220  BP:  (!) 151/73  Pulse:  (!) 107  Resp:  18  Temp: (!) 35.8 C   SpO2:  100%    Last Pain:  Vitals:   06/02/21 1916  TempSrc: Temporal                 Cecile Hearing
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
## 2021-06-02 NOTE — ED Notes (Addendum)
All patient belongings (2) placed in belongings bag before heading to OR.
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
## 2021-06-02 NOTE — Transfer of Care (Signed)
Immediate Anesthesia Transfer of Care Note  Patient: Raylan Hanton  Procedure(s) Performed: EXPLORATORY LAPAROTOMY (Abdomen) EXTERNAL FIXATION LEG (Right: Ankle) IRRIGATION AND DEBRIDEMENT EXTREMITY (Right: Ankle)  Patient Location: PACU  Anesthesia Type:General  Level of Consciousness: Patient remains intubated per anesthesia plan  Airway & Oxygen Therapy: Patient remains intubated per anesthesia plan and Patient placed on Ventilator (see vital sign flow sheet for setting)  Post-op Assessment: Report given to RN and Post -op Vital signs reviewed and stable  Post vital signs: Reviewed and stable  Last Vitals:  Vitals Value Taken Time  BP 151/92 06/02/21 2230  Temp    Pulse 71 06/02/21 2233  Resp 18 06/02/21 2233  SpO2 100 % 06/02/21 2233  Vitals shown include unvalidated device data.  Last Pain:  Vitals:   06/02/21 1916  TempSrc: Temporal         Complications: No notable events documented.

## 2021-06-02 NOTE — Anesthesia Procedure Notes (Addendum)
Arterial Line Insertion Start/End6/24/2022 7:41 PM, 06/02/2021 7:51 PM Performed by: Cecile Hearing, MD, anesthesiologist  Patient location: OR. Preanesthetic checklist: patient identified, IV checked, surgical consent, monitors and equipment checked, pre-op evaluation, timeout performed and anesthesia consent Emergency situation Lidocaine 1% used for infiltration Left, radial was placed Catheter size: 20 Fr Hand hygiene performed  and maximum sterile barriers used   Attempts: 1 Procedure performed without using ultrasound guided technique. Ultrasound Notes:anatomy identified, needle tip was noted to be adjacent to the nerve/plexus identified and no ultrasound evidence of intravascular and/or intraneural injection Following insertion, dressing applied and Biopatch. Post procedure assessment: normal and unchanged  Patient tolerated the procedure well with no immediate complications.
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
## 2021-06-02 NOTE — ED Notes (Signed)
Attempted to make contact with pt's fiance, Leonie Douglas, voicemail box full with no answer. # [phone]

## 2021-06-03 ENCOUNTER — Inpatient Hospital Stay (HOSPITAL_COMMUNITY): Payer: Commercial Managed Care - PPO

## 2021-06-03 ENCOUNTER — Inpatient Hospital Stay (HOSPITAL_COMMUNITY): Payer: Commercial Managed Care - PPO | Admitting: Anesthesiology

## 2021-06-03 ENCOUNTER — Encounter (HOSPITAL_COMMUNITY): Payer: Self-pay | Admitting: General Surgery

## 2021-06-03 ENCOUNTER — Encounter (HOSPITAL_COMMUNITY): Admission: EM | Disposition: A | Discharge status: Home / Self Care | Payer: Self-pay | Source: Home / Self Care

## 2021-06-03 LAB — URINALYSIS, ROUTINE W REFLEX MICROSCOPIC
Bilirubin Urine: NEGATIVE
Glucose, UA: NEGATIVE mg/dL
Hgb urine dipstick: NEGATIVE
Ketones, ur: NEGATIVE mg/dL
Leukocytes,Ua: NEGATIVE
Nitrite: NEGATIVE
Protein, ur: NEGATIVE mg/dL
Specific Gravity, Urine: 1.024 (ref 1.005–1.030)
pH: 5 (ref 5.0–8.0)

## 2021-06-03 LAB — POCT I-STAT 7, (LYTES, BLD GAS, ICA,H+H)
Acid-Base Excess: 2 mmol/L (ref 0.0–2.0)
Acid-Base Excess: 4 mmol/L — ABNORMAL HIGH (ref 0.0–2.0)
Bicarbonate: 27 mmol/L (ref 20.0–28.0)
Bicarbonate: 29.6 mmol/L — ABNORMAL HIGH (ref 20.0–28.0)
Calcium, Ion: 1.16 mmol/L (ref 1.15–1.40)
Calcium, Ion: 1.08 mmol/L — ABNORMAL LOW (ref 1.15–1.40)
HCT: 27 % — ABNORMAL LOW (ref 39.0–52.0)
HCT: 26 % — ABNORMAL LOW (ref 39.0–52.0)
Hemoglobin: 9.2 g/dL — ABNORMAL LOW (ref 13.0–17.0)
Hemoglobin: 8.8 g/dL — ABNORMAL LOW (ref 13.0–17.0)
O2 Saturation: 100 %
O2 Saturation: 100 %
Patient temperature: 97.7
Potassium: 3.6 mmol/L (ref 3.5–5.1)
Potassium: 4 mmol/L (ref 3.5–5.1)
Sodium: 141 mmol/L (ref 135–145)
Sodium: 140 mmol/L (ref 135–145)
TCO2: 28 mmol/L (ref 22–32)
TCO2: 31 mmol/L (ref 22–32)
pCO2 arterial: 39.9 mmHg (ref 32.0–48.0)
pCO2 arterial: 51.6 mmHg — ABNORMAL HIGH (ref 32.0–48.0)
pH, Arterial: 7.437 (ref 7.350–7.450)
pH, Arterial: 7.367 (ref 7.350–7.450)
pO2, Arterial: 193 mmHg — ABNORMAL HIGH (ref 83.0–108.0)
pO2, Arterial: 344 mmHg — ABNORMAL HIGH (ref 83.0–108.0)

## 2021-06-03 LAB — PREPARE CRYOPRECIPITATE
Unit division: 0
Unit division: 0

## 2021-06-03 LAB — BPAM CRYOPRECIPITATE
Blood Product Expiration Date: 202206250213
Blood Product Expiration Date: 202206250230
ISSUE DATE / TIME: 202206242045
ISSUE DATE / TIME: 202206242045
Unit Type and Rh: 5100
Unit Type and Rh: 5100

## 2021-06-03 LAB — BPAM FFP
Blood Product Expiration Date: 202206252359
Blood Product Expiration Date: 202206252359
Blood Product Expiration Date: 202206252359
Blood Product Expiration Date: 202206252359
ISSUE DATE / TIME: 202206241903
ISSUE DATE / TIME: 202206241903
ISSUE DATE / TIME: 202206241903
ISSUE DATE / TIME: 202206241903
Unit Type and Rh: 6200
Unit Type and Rh: 6200
Unit Type and Rh: 6200
Unit Type and Rh: 6200

## 2021-06-03 LAB — PREPARE FRESH FROZEN PLASMA
Unit division: 0
Unit division: 0
Unit division: 0
Unit division: 0

## 2021-06-03 LAB — CBC
HCT: 29.5 % — ABNORMAL LOW (ref 39.0–52.0)
Hemoglobin: 9.8 g/dL — ABNORMAL LOW (ref 13.0–17.0)
MCH: 28.2 pg (ref 26.0–34.0)
MCHC: 33.2 g/dL (ref 30.0–36.0)
MCV: 85 fL (ref 80.0–100.0)
Platelets: 139 10*3/uL — ABNORMAL LOW (ref 150–400)
RBC: 3.47 MIL/uL — ABNORMAL LOW (ref 4.22–5.81)
RDW: 14.6 % (ref 11.5–15.5)
WBC: 7.5 10*3/uL (ref 4.0–10.5)
nRBC: 0 % (ref 0.0–0.2)

## 2021-06-03 LAB — BASIC METABOLIC PANEL
Anion gap: 6 (ref 5–15)
BUN: 17 mg/dL (ref 6–20)
CO2: 26 mmol/L (ref 22–32)
Calcium: 7.9 mg/dL — ABNORMAL LOW (ref 8.9–10.3)
Chloride: 106 mmol/L (ref 98–111)
Creatinine, Ser: 1.19 mg/dL (ref 0.61–1.24)
GFR, Estimated: >60 mL/min (ref >60)
Glucose, Bld: 129 mg/dL — ABNORMAL HIGH (ref 70–99)
Potassium: 3.7 mmol/L (ref 3.5–5.1)
Sodium: 138 mmol/L (ref 135–145)

## 2021-06-03 LAB — PROTIME-INR
INR: 1.2 (ref 0.8–1.2)
Prothrombin Time: 14.9 seconds (ref 11.4–15.2)

## 2021-06-03 LAB — BPAM PLATELET PHERESIS
Blood Product Expiration Date: 202206252359
ISSUE DATE / TIME: 202206242018
Unit Type and Rh: 6200

## 2021-06-03 LAB — VITAMIN D 25 HYDROXY (VIT D DEFICIENCY, FRACTURES): Vit D, 25-Hydroxy: 33.09 ng/mL (ref 30–100)

## 2021-06-03 LAB — TRIGLYCERIDES: Triglycerides: 32 mg/dL (ref <150)

## 2021-06-03 LAB — MRSA NEXT GEN BY PCR, NASAL: MRSA by PCR Next Gen: NOT DETECTED

## 2021-06-03 LAB — PREPARE PLATELET PHERESIS: Unit division: 0

## 2021-06-03 LAB — HIV ANTIBODY (ROUTINE TESTING W REFLEX): HIV Screen 4th Generation wRfx: NONREACTIVE

## 2021-06-03 LAB — BLOOD PRODUCT ORDER (VERBAL) VERIFICATION

## 2021-06-03 SURGERY — POSTERIOR LUMBAR FUSION 2 LEVEL
Anesthesia: General | Site: Spine Lumbar

## 2021-06-03 MED ORDER — DEXAMETHASONE SODIUM PHOSPHATE 10 MG/ML IJ SOLN
INTRAMUSCULAR | Status: DC | PRN
  Administered 2021-06-03: 10 mg via INTRAVENOUS

## 2021-06-03 MED ORDER — 0.9 % SODIUM CHLORIDE (POUR BTL) OPTIME
TOPICAL | Status: DC | PRN
  Administered 2021-06-03: 1000 mL

## 2021-06-03 MED ORDER — OXYCODONE HCL 5 MG PO TABS
5.0000 mg | ORAL_TABLET | ORAL | Status: DC | PRN
  Administered 2021-06-03 – 2021-06-05 (×3): 5 mg via ORAL
  Filled 2021-06-03 (×3): qty 1

## 2021-06-03 MED ORDER — CEFAZOLIN SODIUM-DEXTROSE 2-3 GM-%(50ML) IV SOLR
INTRAVENOUS | Status: DC | PRN
  Administered 2021-06-03: 2 g via INTRAVENOUS

## 2021-06-03 MED ORDER — BUPIVACAINE HCL (PF) 0.5 % IJ SOLN
INTRAMUSCULAR | Status: AC
  Filled 2021-06-03: qty 30

## 2021-06-03 MED ORDER — BUPIVACAINE LIPOSOME 1.3 % IJ SUSP
INTRAMUSCULAR | Status: AC
  Filled 2021-06-03: qty 20

## 2021-06-03 MED ORDER — HYDROMORPHONE HCL 1 MG/ML IJ SOLN
1.0000 mg | INTRAMUSCULAR | Status: DC | PRN
  Administered 2021-06-04: 1 mg via INTRAVENOUS
  Filled 2021-06-03: qty 1

## 2021-06-03 MED ORDER — THROMBIN 5000 UNITS EX SOLR
CUTANEOUS | Status: AC
  Filled 2021-06-03: qty 5000

## 2021-06-03 MED ORDER — VITAL AF 1.2 CAL PO LIQD
1000.0000 mL | ORAL | Status: DC
  Filled 2021-06-03: qty 1000

## 2021-06-03 MED ORDER — ALBUMIN HUMAN 5 % IV SOLN: INTRAVENOUS | Status: DC | PRN

## 2021-06-03 MED ORDER — PROPOFOL 10 MG/ML IV BOLUS
INTRAVENOUS | Status: DC | PRN
  Administered 2021-06-03: 40 mg via INTRAVENOUS

## 2021-06-03 MED ORDER — PROPOFOL 10 MG/ML IV BOLUS
INTRAVENOUS | Status: AC
  Filled 2021-06-03: qty 20

## 2021-06-03 MED ORDER — FENTANYL CITRATE (PF) 250 MCG/5ML IJ SOLN
INTRAMUSCULAR | Status: AC
  Filled 2021-06-03: qty 5

## 2021-06-03 MED ORDER — GABAPENTIN 600 MG PO TABS
300.0000 mg | ORAL_TABLET | Freq: Three times a day (TID) | ORAL | Status: DC
  Administered 2021-06-03 – 2021-06-06 (×9): 300 mg via ORAL
  Filled 2021-06-03 (×10): qty 0.5

## 2021-06-03 MED ORDER — PROSOURCE TF PO LIQD: 45.0000 mL | Freq: Two times a day (BID) | ORAL | Status: DC

## 2021-06-03 MED ORDER — KETOROLAC TROMETHAMINE 15 MG/ML IJ SOLN
15.0000 mg | Freq: Three times a day (TID) | INTRAMUSCULAR | Status: DC
  Administered 2021-06-03 – 2021-06-06 (×8): 15 mg via INTRAVENOUS
  Filled 2021-06-03 (×9): qty 1

## 2021-06-03 MED ORDER — FENTANYL CITRATE (PF) 100 MCG/2ML IJ SOLN
INTRAMUSCULAR | Status: DC | PRN
  Administered 2021-06-03 (×5): 50 ug via INTRAVENOUS

## 2021-06-03 MED ORDER — LIDOCAINE-EPINEPHRINE 1 %-1:100000 IJ SOLN
INTRAMUSCULAR | Status: AC
  Filled 2021-06-03: qty 1

## 2021-06-03 MED ORDER — ACETAMINOPHEN 325 MG PO TABS
650.0000 mg | ORAL_TABLET | Freq: Four times a day (QID) | ORAL | Status: DC
  Administered 2021-06-04 – 2021-06-06 (×10): 650 mg via ORAL
  Filled 2021-06-03 (×11): qty 2

## 2021-06-03 MED ORDER — THROMBIN 5000 UNITS EX SOLR: OROMUCOSAL | Status: DC | PRN

## 2021-06-03 MED ORDER — VITAL HIGH PROTEIN PO LIQD: 1000.0000 mL | ORAL | Status: DC

## 2021-06-03 MED ORDER — LACTATED RINGERS IV SOLN: INTRAVENOUS | Status: DC | PRN

## 2021-06-03 MED ORDER — SUGAMMADEX SODIUM 200 MG/2ML IV SOLN
INTRAVENOUS | Status: DC | PRN
  Administered 2021-06-03: 200 mg via INTRAVENOUS

## 2021-06-03 MED ORDER — OXYCODONE HCL 5 MG PO TABS
10.0000 mg | ORAL_TABLET | ORAL | Status: DC | PRN
Start: 2021-06-03 — End: 2021-06-06
  Administered 2021-06-04 – 2021-06-06 (×7): 10 mg via ORAL
  Filled 2021-06-03 (×8): qty 2

## 2021-06-03 MED ORDER — THROMBIN 20000 UNITS EX SOLR
CUTANEOUS | Status: AC
  Filled 2021-06-03: qty 20000

## 2021-06-03 MED ORDER — LIDOCAINE-EPINEPHRINE 1 %-1:100000 IJ SOLN
INTRAMUSCULAR | Status: DC | PRN
  Administered 2021-06-03: 10 mL

## 2021-06-03 MED ORDER — CHLORHEXIDINE GLUCONATE CLOTH 2 % EX PADS: 6.0000 | MEDICATED_PAD | Freq: Every day | CUTANEOUS | Status: DC

## 2021-06-03 MED ORDER — PHENYLEPHRINE HCL-NACL 10-0.9 MG/250ML-% IV SOLN
INTRAVENOUS | Status: DC | PRN
  Administered 2021-06-03: 25 ug/min via INTRAVENOUS

## 2021-06-03 MED ORDER — THROMBIN 20000 UNITS EX SOLR: CUTANEOUS | Status: DC | PRN

## 2021-06-03 MED ORDER — ROCURONIUM BROMIDE 100 MG/10ML IV SOLN
INTRAVENOUS | Status: DC | PRN
  Administered 2021-06-03: 50 mg via INTRAVENOUS
  Administered 2021-06-03: 30 mg via INTRAVENOUS

## 2021-06-03 SURGICAL SUPPLY — 69 items
BAND RUBBER #18 3X1/16 STRL (MISCELLANEOUS) IMPLANT
BASKET BONE COLLECTION (BASKET) ×3 IMPLANT
BLADE CLIPPER SURG (BLADE) IMPLANT
BUR MATCHSTICK NEURO 3.0 LAGG (BURR) IMPLANT
BUR PRECISION FLUTE 5.0 (BURR) ×3 IMPLANT
CANISTER SUCT 3000ML PPV (MISCELLANEOUS) ×3 IMPLANT
CNTNR URN SCR LID CUP LEK RST (MISCELLANEOUS) ×1 IMPLANT
CONT SPEC 4OZ STRL OR WHT (MISCELLANEOUS) ×2
COVER BACK TABLE 60X90IN (DRAPES) ×3 IMPLANT
COVER WAND RF STERILE (DRAPES) IMPLANT
DECANTER SPIKE VIAL GLASS SM (MISCELLANEOUS) IMPLANT
DERMABOND ADVANCED (GAUZE/BANDAGES/DRESSINGS) ×2
DERMABOND ADVANCED .7 DNX12 (GAUZE/BANDAGES/DRESSINGS) ×1 IMPLANT
DRAIN JACKSON PRATT 10MM FLAT (MISCELLANEOUS) IMPLANT
DRAPE 3/4 80X56 (DRAPES) ×3 IMPLANT
DRAPE C-ARM 42X72 X-RAY (DRAPES) ×3 IMPLANT
DRAPE C-ARMOR (DRAPES) ×3 IMPLANT
DRAPE LAPAROTOMY 100X72X124 (DRAPES) ×3 IMPLANT
DRAPE MICROSCOPE LEICA (MISCELLANEOUS) IMPLANT
DRSG OPSITE POSTOP 4X6 (GAUZE/BANDAGES/DRESSINGS) IMPLANT
DURAPREP 26ML APPLICATOR (WOUND CARE) ×3 IMPLANT
ELECT BLADE INSULATED 6.5IN (ELECTROSURGICAL) ×3
ELECT REM PT RETURN 9FT ADLT (ELECTROSURGICAL) ×3
ELECTRODE BLDE INSULATED 6.5IN (ELECTROSURGICAL) ×1 IMPLANT
ELECTRODE REM PT RTRN 9FT ADLT (ELECTROSURGICAL) ×1 IMPLANT
EVACUATOR SILICONE 100CC (DRAIN) IMPLANT
EXTENDER TAB GUIDE SV 5.5/6.0 (INSTRUMENTS) ×36 IMPLANT
GAUZE 4X4 16PLY RFD (DISPOSABLE) ×3 IMPLANT
GAUZE SPONGE 4X4 12PLY STRL (GAUZE/BANDAGES/DRESSINGS) IMPLANT
GLOVE EXAM NITRILE XL STR (GLOVE) IMPLANT
GLOVE SURG LTX SZ7.5 (GLOVE) ×6 IMPLANT
GLOVE SURG UNDER POLY LF SZ7.5 (GLOVE) ×3 IMPLANT
GOWN STRL REUS W/ TWL LRG LVL3 (GOWN DISPOSABLE) ×1 IMPLANT
GOWN STRL REUS W/ TWL XL LVL3 (GOWN DISPOSABLE) ×2 IMPLANT
GOWN STRL REUS W/TWL 2XL LVL3 (GOWN DISPOSABLE) IMPLANT
GOWN STRL REUS W/TWL LRG LVL3 (GOWN DISPOSABLE) ×2
GOWN STRL REUS W/TWL XL LVL3 (GOWN DISPOSABLE) ×4
GUIDEWIRE BLUNT NT 450 (WIRE) ×18 IMPLANT
HEMOSTAT POWDER KIT SURGIFOAM (HEMOSTASIS) ×3 IMPLANT
KIT BASIN OR (CUSTOM PROCEDURE TRAY) ×3 IMPLANT
KIT TURNOVER KIT B (KITS) ×3 IMPLANT
MILL MEDIUM DISP (BLADE) ×3 IMPLANT
NEEDLE ASPIRATION RAN815N 8X15 (NEEDLE) ×2 IMPLANT
NEEDLE ASPIRATION RANFAC 8X15 (NEEDLE) ×4
NEEDLE HYPO 18GX1.5 BLUNT FILL (NEEDLE) IMPLANT
NEEDLE HYPO 21X1.5 SAFETY (NEEDLE) ×3 IMPLANT
NEEDLE HYPO 22GX1.5 SAFETY (NEEDLE) ×3 IMPLANT
NEEDLE SPNL 18GX3.5 QUINCKE PK (NEEDLE) IMPLANT
NS IRRIG 1000ML POUR BTL (IV SOLUTION) ×3 IMPLANT
PACK LAMINECTOMY NEURO (CUSTOM PROCEDURE TRAY) ×36 IMPLANT
PAD ARMBOARD 7.5X6 YLW CONV (MISCELLANEOUS) ×24 IMPLANT
ROD PERC CCM 5.5X80 (Rod) ×6 IMPLANT
SCREW 5.5 VOYAGER MAS 6.5X50 (Screw) ×6 IMPLANT
SCREW MAS VOYAGER 6.5X35 (Screw) ×6 IMPLANT
SCREW MAS VOYAGER 7.5X45 (Screw) ×6 IMPLANT
SCREW SET 5.5/6.0MM SOLERA (Screw) ×18 IMPLANT
SPONGE LAP 4X18 RFD (DISPOSABLE) IMPLANT
SPONGE SURGIFOAM ABS GEL 100 (HEMOSTASIS) ×3 IMPLANT
STAPLER VISISTAT 35W (STAPLE) ×3 IMPLANT
SUT MNCRL AB 3-0 PS2 18 (SUTURE) ×3 IMPLANT
SUT MNCRL AB 4-0 PS2 18 (SUTURE) ×3 IMPLANT
SUT VIC AB 0 CT1 18XCR BRD8 (SUTURE) ×3 IMPLANT
SUT VIC AB 0 CT1 8-18 (SUTURE) ×6
SUT VIC AB 2-0 CP2 18 (SUTURE) ×6 IMPLANT
SYR 30ML LL (SYRINGE) ×3 IMPLANT
TOWEL GREEN STERILE (TOWEL DISPOSABLE) ×3 IMPLANT
TOWEL GREEN STERILE FF (TOWEL DISPOSABLE) ×3 IMPLANT
TRAY FOLEY MTR SLVR 16FR STAT (SET/KITS/TRAYS/PACK) ×3 IMPLANT
WATER STERILE IRR 1000ML POUR (IV SOLUTION) ×3 IMPLANT

## 2021-06-03 NOTE — Progress Notes (Signed)
Pt placed on a SBT with a PS 5 and PEEP 5, FIO2 30%. Tolerating well at this time.
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
## 2021-06-03 NOTE — Progress Notes (Signed)
OT Cancellation Note  Patient Details Name: Aaron Webster MRN: [id number] DOB: 29-Nov-1971   Cancelled Treatment:    Reason Eval/Treat Not Completed: Patient not medically ready.   Pt on flat bedrest until MRI and awaiting plan by NS.  He is also intubated and sedated.  Will reattempt.  [name]., OTR/L Acute Rehabilitation Services Pager [phone] Office [phone]   [name] M 06/03/2021, 12:31 PM

## 2021-06-03 NOTE — Procedures (Signed)
Extubation Procedure Note  Patient Details:   Name: Ramona Slinger DOB: 1971/06/21 MRN: [id number]   Airway Documentation:    Vent end date: 06/03/21 Vent end time: 1820   Evaluation  O2 sats: stable throughout Complications: No apparent complications Patient did tolerate procedure well. Bilateral Breath Sounds: Clear   Yes  Dewain Penning T 06/03/2021, 6:35 PM
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
## 2021-06-03 NOTE — Transfer of Care (Signed)
Immediate Anesthesia Transfer of Care Note  Patient: Aaron Webster  Procedure(s) Performed: Lumbar two - lumbar three-  lumbar four posterior percutaneous instrumentation (Spine Lumbar)  Patient Location: ICU  Anesthesia Type:General  Level of Consciousness: sedated and unresponsive  Airway & Oxygen Therapy: Patient remains intubated per anesthesia plan and Patient placed on Ventilator (see vital sign flow sheet for setting)  Post-op Assessment: Report given to RN and Post -op Vital signs reviewed and stable  Post vital signs: Reviewed and stable  Last Vitals:  Vitals Value Taken Time  BP    Temp    Pulse    Resp    SpO2      Last Pain:  Vitals:   06/03/21 0800  TempSrc: Oral         Complications: No notable events documented.

## 2021-06-03 NOTE — Progress Notes (Signed)
C Spine cleared by Dr Bedelia Person. C Collar removed by Dr Bedelia Person.

## 2021-06-03 NOTE — Progress Notes (Signed)
Pt taken to OR by CRNAs.

## 2021-06-03 NOTE — Anesthesia Postprocedure Evaluation (Signed)
Anesthesia Post Note  Patient: Aaron Webster  Procedure(s) Performed: Lumbar two - lumbar three-  lumbar four posterior percutaneous instrumentation (Spine Lumbar)     Patient location during evaluation: ICU Anesthesia Type: General Level of consciousness: patient remains intubated per anesthesia plan Pain management: pain level controlled Vital Signs Assessment: post-procedure vital signs reviewed and stable Respiratory status: patient remains intubated per anesthesia plan Postop Assessment: no apparent nausea or vomiting Anesthetic complications: no   No notable events documented.  Last Vitals:  Vitals:   06/03/21 1806 06/03/21 1807  BP:    Pulse: 76 83  Resp: (!) 8 (!) 9  Temp:    SpO2: 100% 100%    Last Pain:  Vitals:   06/03/21 0800  TempSrc: Oral                 Drexler Maland
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
## 2021-06-03 NOTE — Progress Notes (Signed)
PT Cancellation Note  Patient Details Name: Aaron Webster MRN: [id number] DOB: [birth year]   Cancelled Treatment:    Reason Eval/Treat Not Completed: Medical issues which prohibited therapy;Patient's level of consciousness;Active bedrest order. Pt with active bedrest orders, sedated, and intubated. Will follow-up another day as appropriate.   Raymond Gurney, PT, DPT Acute Rehabilitation Services  Pager: [phone] Office: [phone]    Jewel Baize 06/03/2021, 1:02 PM
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
## 2021-06-03 NOTE — Progress Notes (Signed)
Arrived at MRI. Continuous HR and O2 Sat monitoring in place.

## 2021-06-03 NOTE — Progress Notes (Signed)
[name] authorizes pt's significant other Aaron Webster to serve as health care proxy if/ when [name]) is not at bedside.

## 2021-06-03 NOTE — Progress Notes (Signed)
Pt transported to CT and back to 4N17 by RT, RN and transport. No complications, RT will continue to monitor.
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
## 2021-06-03 NOTE — Progress Notes (Signed)
Pt returned from OR. NMB reversed by CRNA. Vent placed on CPAP by MD. Prop and fent gtt stopped in preparation for extubation. CRNA, MD RN at bedside.

## 2021-06-04 LAB — BASIC METABOLIC PANEL
Anion gap: 5 (ref 5–15)
BUN: 14 mg/dL (ref 6–20)
CO2: 26 mmol/L (ref 22–32)
Calcium: 7.8 mg/dL — ABNORMAL LOW (ref 8.9–10.3)
Chloride: 107 mmol/L (ref 98–111)
Creatinine, Ser: 1.21 mg/dL (ref 0.61–1.24)
GFR, Estimated: >60 mL/min (ref >60)
Glucose, Bld: 120 mg/dL — ABNORMAL HIGH (ref 70–99)
Potassium: 3.9 mmol/L (ref 3.5–5.1)
Sodium: 138 mmol/L (ref 135–145)

## 2021-06-04 LAB — CBC
HCT: 27.1 % — ABNORMAL LOW (ref 39.0–52.0)
Hemoglobin: 9.1 g/dL — ABNORMAL LOW (ref 13.0–17.0)
MCH: 28.7 pg (ref 26.0–34.0)
MCHC: 33.6 g/dL (ref 30.0–36.0)
MCV: 85.5 fL (ref 80.0–100.0)
Platelets: 123 10*3/uL — ABNORMAL LOW (ref 150–400)
RBC: 3.17 MIL/uL — ABNORMAL LOW (ref 4.22–5.81)
RDW: 14.6 % (ref 11.5–15.5)
WBC: 8 10*3/uL (ref 4.0–10.5)
nRBC: 0 % (ref 0.0–0.2)

## 2021-06-04 MED ORDER — ENOXAPARIN SODIUM 40 MG/0.4ML IJ SOSY
40.0000 mg | PREFILLED_SYRINGE | Freq: Every day | INTRAMUSCULAR | Status: DC
  Administered 2021-06-04 – 2021-06-06 (×3): 40 mg via SUBCUTANEOUS
  Filled 2021-06-04 (×3): qty 0.4

## 2021-06-04 NOTE — Progress Notes (Addendum)
The patient is transferred from 4 N ICU to 5 N. room 20. A & O x 4. Denied any acute pain. Will continue to monitor.

## 2021-06-04 NOTE — Progress Notes (Signed)
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 57 seconds. Radiation Exposure Index (if provided by the fluoroscopic device): 37.13 mGy. Number  of Acquired Spot Images: 5 COMPARISON:  June 03, 2021. FINDINGS: Five C-arm fluoroscopic images were obtained intraoperatively and submitted for post operative interpretation. These images demonstrate bilateral posterior pedicle screw placement at L2, L3, and L4 with intervening bilateral rods. Redemonstrated L3 vertebral body fracture, better characterized on prior MRI please see the performing provider's procedural report for further detail. IMPRESSION: L2-L4 PLIF. Electronically Signed   By: Feliberto HartsFrederick S Jones MD   On: 06/03/2021 19:17   DG C-Arm 1-60 Min  Result Date: 06/02/2021 CLINICAL DATA:  External fixation placement EXAM: RIGHT ANKLE - COMPLETE 3+ VIEW; DG C-ARM 1-60 MIN COMPARISON:  Radiograph 06/02/2021 FINDINGS: Redemonstration of a highly comminuted, complex fractures of the distal tibia and fibula with interval placement of an external fixation device secured proximally by trans diaphyseal screws in the tibia and distally with partially visualized fixation pins at the level of the calcaneus. Some reduced displacement and angulation across the fractures post external fixation.  IMPRESSION: Placement of an external fixation construct with reduced angulation and displacement across the highly comminuted distal tibia and fibular fractures. See operative report for further details. Electronically Signed   By: Kreg ShropshirePrice  DeHay M.D.   On: 06/02/2021 22:17    Assessment/Plan: S/p L2-3-4 posterior fixation - no need for TLSO brace - from spine standpoint, okay to mobilize with limited bending, twisting, and no lifting > 10 lbs -  ok for pharmacologic DVT ppx    Bedelia PersonJonathan G Channelle Bottger 06/04/2021, 9:39 AM
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
## 2021-06-04 NOTE — Progress Notes (Signed)
Patient ID: Aaron Webster, male   DOB: [birth year], 50 y.o.   MRN: [id number]    ORTHOPAEDIC PROGRESS NOTE  s/p Procedure(s): Lumbar two - lumbar three-  lumbar four posterior percutaneous instrumentation.  EX Fix to right ankle comminuted fracture.  Internal fixation of distal tib/fib planned for later this coming week  SUBJECTIVE: Reports mild pain about operative site. No chest pain. No SOB. No nausea/vomiting. No other complaints.  OBJECTIVE: PE:  Vitals:   06/04/21 0700 06/04/21 0800  BP: 118/69 113/70  Pulse: 67 66  Resp: 17 20  Temp:  98.1 F (36.7 C)  SpO2: 92% 92%     ASSESSMENT: Aaron Webster is a 50 y.o. male doing well postoperatively.  PLAN: Weightbearing: NWB RLE Insicional and dressing care: Daily dressing changes with pin care Orthopedic device(s): None Showering: none Pain control: well controlled Follow - up plan:  Internal fixation of the right ankle will be done Wednesday or Thursday by Dr Samson Frederic information:   Jowanda Heeg A. Gwinda Passe Physician Assistant [facility] [phone]  06/04/2021, 9:39 AM

## 2021-06-04 NOTE — Progress Notes (Signed)
This RN wasted of Fentanyl 2540mcg/250ml in the stericycle with Ammie Ferrier RN at [phone].

## 2021-06-05 ENCOUNTER — Encounter (HOSPITAL_COMMUNITY): Payer: Self-pay | Admitting: General Surgery

## 2021-06-05 LAB — POCT I-STAT 7, (LYTES, BLD GAS, ICA,H+H)
Acid-base deficit: 2 mmol/L (ref 0.0–2.0)
Bicarbonate: 26.2 mmol/L (ref 20.0–28.0)
Calcium, Ion: 0.93 mmol/L — ABNORMAL LOW (ref 1.15–1.40)
HCT: 38 % — ABNORMAL LOW (ref 39.0–52.0)
Hemoglobin: 12.9 g/dL — ABNORMAL LOW (ref 13.0–17.0)
O2 Saturation: 100 %
Patient temperature: 37
Potassium: 4.1 mmol/L (ref 3.5–5.1)
Sodium: 137 mmol/L (ref 135–145)
TCO2: 28 mmol/L (ref 22–32)
pCO2 arterial: 61.7 mmHg — ABNORMAL HIGH (ref 32.0–48.0)
pH, Arterial: 7.236 — ABNORMAL LOW (ref 7.350–7.450)
pO2, Arterial: 563 mmHg — ABNORMAL HIGH (ref 83.0–108.0)

## 2021-06-05 LAB — BASIC METABOLIC PANEL
Anion gap: 4 — ABNORMAL LOW (ref 5–15)
BUN: 14 mg/dL (ref 6–20)
CO2: 29 mmol/L (ref 22–32)
Calcium: 7.8 mg/dL — ABNORMAL LOW (ref 8.9–10.3)
Chloride: 103 mmol/L (ref 98–111)
Creatinine, Ser: 1.19 mg/dL (ref 0.61–1.24)
GFR, Estimated: >60 mL/min (ref >60)
Glucose, Bld: 87 mg/dL (ref 70–99)
Potassium: 3.4 mmol/L — ABNORMAL LOW (ref 3.5–5.1)
Sodium: 136 mmol/L (ref 135–145)

## 2021-06-05 MED ORDER — ADULT MULTIVITAMIN W/MINERALS CH
1.0000 | ORAL_TABLET | Freq: Every day | ORAL | Status: DC
  Administered 2021-06-05 – 2021-06-06 (×2): 1 via ORAL
  Filled 2021-06-05 (×2): qty 1

## 2021-06-05 MED ORDER — ENSURE ENLIVE PO LIQD
237.0000 mL | Freq: Three times a day (TID) | ORAL | Status: DC
  Administered 2021-06-05: 237 mL via ORAL
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
## 2021-06-05 NOTE — Addendum Note (Signed)
Addendum  created 06/05/21 0736 by Rachel Moulds, CRNA   Order list changed

## 2021-06-05 NOTE — Progress Notes (Signed)
Orthopaedic Trauma Progress Note  SUBJECTIVE: Pain in right ankle remains controlled. No chest pain. No SOB. No nausea/vomiting. No other complaints. Son at bedside this morning.   OBJECTIVE:  Vitals:   06/04/21 2221 06/05/21 0754  BP: 116/70 120/66  Pulse: (!) 56 63  Resp: 17 17  Temp: 98.1 F (36.7 C) 98 F (36.7 C)  SpO2: 99% 96%    General: Sitting up in bed, NAD Respiratory: No increased work of breathing.  RLE: Ex fix in place, pin sites dressing CDI. Able to wiggle toes. Bruising over dorsum of foot. Swelling about ankle present but improving.   IMAGING: Stable post op imaging.   LABS:  Results for orders placed or performed during the hospital encounter of 06/02/21 (from the past 24 hour(s))  Basic metabolic panel     Status: Abnormal   Collection Time: 06/05/21  2:00 AM  Result Value Ref Range   Sodium 136 135 - 145 mmol/L   Potassium 3.4 (L) 3.5 - 5.1 mmol/L   Chloride 103 98 - 111 mmol/L   CO2 29 22 - 32 mmol/L   Glucose, Bld 87 70 - 99 mg/dL   BUN 14 6 - 20 mg/dL   Creatinine, Ser 0.63 0.61 - 1.24 mg/dL   Calcium 7.8 (L) 8.9 - 10.3 mg/dL   GFR, Estimated >01 >60 mL/min   Anion gap 4 (L) 5 - 15  BLOOD TRANSFUSION REPORT - SCANNED     Status: None   Collection Time: 06/05/21 10:13 AM   Narrative   Ordered by an unspecified provider.    ASSESSMENT: Aaron Webster is a 50 y.o. male, 3 Days Post-Op s/p I&D right lower extremity with external fixation right pilon fracture  CV/Blood loss: Hgb 9.1 yesterday, CBC pending this AM. Hemodynamically stable  PLAN: Weightbearing: NWB RLE Incisional and dressing care:  Pin site dressing change PRN Showering: Ok to shower from ortho standpoint with ex-fix in place. Avoid getting ex-fix wet for now.  Orthopedic device(s):  Ex-fix RLE   Pain management:  1. Tylenol 650 mg q 6 hours scheduled 2. Robaxin 500 mg q 6 hours PRN 3. Oxycodone 5-10 mg q 4 hours PRN 4. Neurontin 100 mg TID 5. Dilaudid 1 mg q 3 hours PRN VTE  prophylaxis: Lovenox, SCDs ID:  Zosyn per trauma Foley/Lines:  No foley, KVO IVFs Impediments to Fracture Healing: Open fracture. Vit D level 33, no supplementation needed Dispo: PT/OT as tolerated. Continue to monitor swelling, we will tentatively plan to return to OR 06/07/21 for removal of ex-fix and ORIF R pilon  Follow - up plan:  TBD  Contact information:  Truitt Merle MD, Ulyses Southward PA-C. After hours and holidays please check [email or web address] for group call information for [facility]   Aaron Goodwyn A. Michaelyn Barter, PA-C [phone] (office) [email or web address]
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
## 2021-06-06 ENCOUNTER — Other Ambulatory Visit (HOSPITAL_COMMUNITY): Payer: Self-pay

## 2021-06-06 LAB — TYPE AND SCREEN
ABO/RH(D): B POS
Antibody Screen: NEGATIVE
Unit division: 0
Unit division: 0
Unit division: 0
Unit division: 0
Unit division: 0
Unit division: 0
Unit division: 0
Unit division: 0
Unit division: 0
Unit division: 0
Unit division: 0
Unit division: 0
Unit division: 0
Unit division: 0

## 2021-06-06 LAB — BPAM RBC
Blood Product Expiration Date: 202207102359
Blood Product Expiration Date: 202207172359
Blood Product Expiration Date: 202207172359
Blood Product Expiration Date: 202207182359
Blood Product Expiration Date: 202207182359
Blood Product Expiration Date: 202207192359
Blood Product Expiration Date: 202207212359
Blood Product Expiration Date: 202207222359
Blood Product Expiration Date: 202207222359
Blood Product Expiration Date: 202207242359
Blood Product Expiration Date: 202207272359
Blood Product Expiration Date: 202207272359
Blood Product Expiration Date: 202207272359
Blood Product Expiration Date: 202207282359
ISSUE DATE / TIME: 202206241852
ISSUE DATE / TIME: 202206241901
ISSUE DATE / TIME: 202206241901
ISSUE DATE / TIME: 202206241901
ISSUE DATE / TIME: 202206241901
ISSUE DATE / TIME: 202206242020
ISSUE DATE / TIME: 202206242020
ISSUE DATE / TIME: 202206242020
ISSUE DATE / TIME: 202206251005
ISSUE DATE / TIME: 202206251517
ISSUE DATE / TIME: 202206251517
ISSUE DATE / TIME: 202206271233
Unit Type and Rh: 5100
Unit Type and Rh: 5100
Unit Type and Rh: 5100
Unit Type and Rh: 5100
Unit Type and Rh: 5100
Unit Type and Rh: 5100
Unit Type and Rh: 7300
Unit Type and Rh: 7300
Unit Type and Rh: 7300
Unit Type and Rh: 7300
Unit Type and Rh: 7300
Unit Type and Rh: 7300
Unit Type and Rh: 7300
Unit Type and Rh: 7300

## 2021-06-06 LAB — BASIC METABOLIC PANEL
Anion gap: 10 (ref 5–15)
BUN: 14 mg/dL (ref 6–20)
CO2: 26 mmol/L (ref 22–32)
Calcium: 8.3 mg/dL — ABNORMAL LOW (ref 8.9–10.3)
Chloride: 103 mmol/L (ref 98–111)
Creatinine, Ser: 1.1 mg/dL (ref 0.61–1.24)
GFR, Estimated: >60 mL/min (ref >60)
Glucose, Bld: 80 mg/dL (ref 70–99)
Potassium: 3.5 mmol/L (ref 3.5–5.1)
Sodium: 139 mmol/L (ref 135–145)

## 2021-06-06 LAB — CBC
HCT: 24.7 % — ABNORMAL LOW (ref 39.0–52.0)
Hemoglobin: 8.2 g/dL — ABNORMAL LOW (ref 13.0–17.0)
MCH: 28.8 pg (ref 26.0–34.0)
MCHC: 33.2 g/dL (ref 30.0–36.0)
MCV: 86.7 fL (ref 80.0–100.0)
Platelets: 170 10*3/uL (ref 150–400)
RBC: 2.85 MIL/uL — ABNORMAL LOW (ref 4.22–5.81)
RDW: 14 % (ref 11.5–15.5)
WBC: 6.3 10*3/uL (ref 4.0–10.5)
nRBC: 0 % (ref 0.0–0.2)

## 2021-06-06 LAB — SURGICAL PATHOLOGY

## 2021-06-06 MED ORDER — DOCUSATE SODIUM 100 MG PO CAPS: 100.0000 mg | ORAL_CAPSULE | Freq: Two times a day (BID) | ORAL | Status: DC | PRN

## 2021-06-06 MED ORDER — OXYCODONE HCL 5 MG PO TABS
5.0000 mg | ORAL_TABLET | ORAL | 0 refills | Status: DC | PRN
  Filled 2021-06-06: qty 40, 4d supply, fill #0

## 2021-06-06 MED ORDER — ACETAMINOPHEN 325 MG PO TABS: 650.0000 mg | ORAL_TABLET | Freq: Four times a day (QID) | ORAL | Status: DC | PRN

## 2021-06-06 MED ORDER — ASPIRIN EC 325 MG PO TBEC
325.0000 mg | DELAYED_RELEASE_TABLET | Freq: Every day | ORAL | 0 refills | Status: DC
  Filled 2021-06-06: qty 30, 30d supply, fill #0

## 2021-06-06 MED ORDER — ASPIRIN 325 MG PO TBEC
325.0000 mg | DELAYED_RELEASE_TABLET | Freq: Two times a day (BID) | ORAL | 0 refills | Status: DC
  Filled 2021-06-06: qty 60, 30d supply, fill #0

## 2021-06-06 MED ORDER — GABAPENTIN 300 MG PO CAPS
300.0000 mg | ORAL_CAPSULE | Freq: Three times a day (TID) | ORAL | 0 refills | Status: DC
  Filled 2021-06-06: qty 120, 40d supply, fill #0

## 2021-06-06 MED ORDER — POLYETHYLENE GLYCOL 3350 17 G PO PACK: 17.0000 g | PACK | Freq: Every day | ORAL | Status: DC | PRN
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
## 2021-06-06 NOTE — Discharge Instructions (Addendum)
[facility]      [facility] [phone]  OPEN ABDOMINAL SURGERY: POST OP INSTRUCTIONS  Always review your discharge instruction sheet given to you by the facility where your surgery was performed.  IF YOU HAVE DISABILITY OR FAMILY LEAVE FORMS, YOU MUST BRING THEM TO THE OFFICE FOR PROCESSING.  PLEASE DO NOT GIVE THEM TO YOUR DOCTOR.  A prescription for pain medication may be given to you upon discharge.  Take your pain medication as prescribed, if needed.  If narcotic pain medicine is not needed, then you may take acetaminophen (Tylenol) or ibuprofen (Advil) as needed. Take your usually prescribed medications unless otherwise directed. If you need a refill on your pain medication, please contact your pharmacy. They will contact our office to request authorization.  Prescriptions will not be filled after 5pm or on week-ends. You should follow a light diet the first few days after arrival home, such as soup and crackers, pudding, etc.unless your doctor has advised otherwise. A high-fiber, low fat diet can be resumed as tolerated.   Be sure to include lots of fluids daily. Most patients will experience some swelling and bruising on the chest and neck area.  Ice packs will help.  Swelling and bruising can take several days to resolve Most patients will experience some swelling and bruising in the area of the incision. Ice pack will help. Swelling and bruising can take several days to resolve..  It is common to experience some constipation if taking pain medication after surgery.  Increasing fluid intake and taking a stool softener will usually help or prevent this problem from occurring.  A mild laxative (Milk of Magnesia or Miralax) should be taken according to package directions if there are no bowel movements after 48 hours.  You may have steri-strips (small skin tapes) in place directly over the incision.  These strips should be left on the skin for 7-10 days.  If your surgeon used skin  glue on the incision, you may shower in 24 hours.  The glue will flake off over the next 2-3 weeks.  Any sutures or staples will be removed at the office during your follow-up visit. You may find that a light gauze bandage over your incision may keep your staples from being rubbed or pulled. You may shower and replace the bandage daily. ACTIVITIES:  You may resume regular (light) daily activities beginning the next day--such as daily self-care, walking, climbing stairs--gradually increasing activities as tolerated.  You may have sexual intercourse when it is comfortable.  Refrain from any heavy lifting or straining until approved by your doctor. You may drive when you no longer are taking prescription pain medication, you can comfortably wear a seatbelt, and you can safely maneuver your car and apply brakes Return to Work: ___________________________________ You should see your doctor in the office for a follow-up appointment approximately two weeks after your surgery.  Make sure that you call for this appointment within a day or two after you arrive home to insure a convenient appointment time. OTHER INSTRUCTIONS:  _____________________________________________________________ _____________________________________________________________  WHEN TO CALL YOUR DOCTOR: Fever over 101.0 Inability to urinate Nausea and/or vomiting Extreme swelling or bruising Continued bleeding from incision. Increased pain, redness, or drainage from the incision. Difficulty swallowing or breathing Muscle cramping or spasms. Numbness or tingling in hands or feet or around lips.  The clinic staff is available to answer your questions during regular business hours.  Please don't hesitate to call and ask to speak to one of   the nurses if you have concerns.  For further questions, please visit [email or web address]   WOUND CARE: - dressing to be changed daily - supplies: sterile saline, kerlix/guaze, scissors,  ABD pads, tape  - remove dressing and all packing carefully, moistening with sterile saline as needed to avoid packing/internal dressing sticking to the wound. - clean edges of skin around the wound with water/gauze, making sure there is no tape debris or leakage left on skin that could cause skin irritation or breakdown. - dampen clean kerlix/gauze with sterile saline and pack wound from wound base to skin level, making sure to take note of any possible areas of wound tracking, tunneling and packing appropriately. Wound can be packed loosely. Trim kerlix/gauze to size if a whole roll/piece is not required. - cover wound with a dry ABD pad and secure with tape.  - write the date/time on the dry dressing/tape to better track when the last dressing change occurred. - apply any skin protectant/powder recommended by clinician to protect skin/skin folds. - change dressing as needed if leakage occurs, wound gets contaminated, or patient requests to shower. - patient may shower daily with wound open and following the shower the wound should be dried and a clean dressing placed.    Pin site Care:Dress pins daily with dry Kerlix roll starting on POD 2. Wrap the Kerlix so that it tamps the skin down around the pin --skin interface to prevent motion of the skin relative to the pin.   After POD 3, if no drainage is discernible on the pin site dressing, the interval for change can be increased to every other day.
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
## 2021-06-06 NOTE — Progress Notes (Signed)
Discharge instructions reviewed with pt and sig. Other by SWOT RN Gabe.   Copy of instructions and scripts given to pt. Scripts were filled by hospital pharm and given to pt before discharge by pharm staff.  Pt d/c'd via wheelchair with belongings, with sig other.      Pt and sig other instructed on pin care of external fixator and dressings to external fixator. Also instructed on dsg changes to abdominal wound/incision.  Supplies provided for wound care.  Pt discharged with DME: wheelchair, 3 in 1, walker.      Escorted by  unit staff, RN and NT.

## 2021-06-07 LAB — PREPARE FRESH FROZEN PLASMA: Unit division: 0

## 2021-06-07 LAB — BPAM FFP
Blood Product Expiration Date: 202206292359
Blood Product Expiration Date: 202206292359
Blood Product Expiration Date: 202206292359
Blood Product Expiration Date: 202206292359
ISSUE DATE / TIME: 202206242043
ISSUE DATE / TIME: 202206242043
ISSUE DATE / TIME: 202206242043
ISSUE DATE / TIME: 202206242043
Unit Type and Rh: 1700
Unit Type and Rh: 1700
Unit Type and Rh: 7300
Unit Type and Rh: 7300

## 2021-06-14 ENCOUNTER — Ambulatory Visit: Payer: Self-pay | Admitting: Student

## 2021-06-14 DIAGNOSIS — S82871C Displaced pilon fracture of right tibia, initial encounter for open fracture type IIIA, IIIB, or IIIC: Secondary | ICD-10-CM | POA: Insufficient documentation

## 2021-06-15 ENCOUNTER — Encounter (HOSPITAL_COMMUNITY): Payer: Self-pay | Admitting: Student

## 2021-06-15 NOTE — Progress Notes (Signed)
DUE TO COVID-19 ONLY ONE VISITOR IS ALLOWED TO COME WITH YOU AND STAY IN THE WAITING ROOM ONLY DURING PRE OP AND PROCEDURE DAY OF SURGERY.   PCP - none Cardiologist - n/a  CT Chest x-ray - 06/03/21 EKG - n/a Stress Test - n/a ECHO - n/a Cardiac Cath - n/a  Sleep Study -  n/a CPAP - none  Aspirin Instructions: Follow your surgeon's instructions on when to stop aspirin prior to surgery,  If no instructions were given by your surgeon then you will need to call the office for those instructions.  ERAS: Clear liquids til 9 am DOS.  STOP now taking any Aspirin (unless otherwise instructed by your surgeon), Aleve, Naproxen, Ibuprofen, Motrin, Advil, Goody's, BC's, all herbal medications, fish oil, and all vitamins.   Coronavirus Screening Covid test n/a - Ambulatory Surgery Do you have any of the following symptoms:  Cough yes/no: No Fever (>100.30F)  yes/no: No Runny nose yes/no: No Sore throat yes/no: No Difficulty breathing/shortness of breath  yes/no: No  Have you traveled in the last 14 days and where? yes/no: No  Patient verbalized understanding of instructions that were given via phone.

## 2021-06-16 ENCOUNTER — Encounter (HOSPITAL_COMMUNITY): Payer: Self-pay

## 2021-06-16 ENCOUNTER — Other Ambulatory Visit: Payer: Self-pay

## 2021-06-16 ENCOUNTER — Ambulatory Visit (HOSPITAL_COMMUNITY): Payer: Commercial Managed Care - PPO

## 2021-06-18 ENCOUNTER — Encounter (HOSPITAL_COMMUNITY): Payer: Self-pay | Admitting: Anesthesiology

## 2021-06-19 ENCOUNTER — Encounter (HOSPITAL_COMMUNITY): Payer: Self-pay | Admitting: Student

## 2021-06-19 ENCOUNTER — Encounter (HOSPITAL_COMMUNITY)
Admission: RE | Disposition: A | Discharge status: Home / Self Care | Payer: Self-pay | Source: Home / Self Care | Attending: Student

## 2021-06-19 ENCOUNTER — Ambulatory Visit (HOSPITAL_COMMUNITY)
Admission: RE | Admit: 2021-06-19 | Discharge: 2021-06-19 | Disposition: A | Discharge status: Home / Self Care | Payer: Commercial Managed Care - PPO | Attending: Student | Admitting: Student

## 2021-06-19 DIAGNOSIS — S82871B Displaced pilon fracture of right tibia, initial encounter for open fracture type I or II: Secondary | ICD-10-CM | POA: Diagnosis not present

## 2021-06-19 DIAGNOSIS — Z538 Procedure and treatment not carried out for other reasons: Secondary | ICD-10-CM | POA: Insufficient documentation

## 2021-06-19 DIAGNOSIS — S82871C Displaced pilon fracture of right tibia, initial encounter for open fracture type IIIA, IIIB, or IIIC: Secondary | ICD-10-CM

## 2021-06-19 SURGERY — OPEN REDUCTION INTERNAL FIXATION (ORIF) TIBIA/FIBULA FRACTURE
Anesthesia: General | Laterality: Right

## 2021-06-19 MED ORDER — CHLORHEXIDINE GLUCONATE 0.12 % MT SOLN
15.0000 mL | Freq: Once | OROMUCOSAL | Status: DC
  Filled 2021-06-19: qty 15

## 2021-06-19 MED ORDER — CEFAZOLIN SODIUM-DEXTROSE 2-4 GM/100ML-% IV SOLN
2.0000 g | INTRAVENOUS | Status: DC
  Filled 2021-06-19: qty 100

## 2021-06-19 MED ORDER — LACTATED RINGERS IV SOLN: INTRAVENOUS | Status: DC

## 2021-06-19 MED ORDER — ACETAMINOPHEN 500 MG PO TABS
1000.0000 mg | ORAL_TABLET | Freq: Once | ORAL | Status: DC
  Filled 2021-06-19: qty 2

## 2021-06-19 MED ORDER — ORAL CARE MOUTH RINSE: 15.0000 mL | Freq: Once | OROMUCOSAL | Status: DC
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
## 2021-06-19 NOTE — Progress Notes (Signed)
Surgery cancelled due to swelling at surgery site. PA-C at bedside and has answered all of patients questions. Pt understands and has no further questions.
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
## 2021-06-27 ENCOUNTER — Ambulatory Visit: Payer: Self-pay | Admitting: Student

## 2021-06-27 DIAGNOSIS — S82871C Displaced pilon fracture of right tibia, initial encounter for open fracture type IIIA, IIIB, or IIIC: Secondary | ICD-10-CM

## 2021-06-28 ENCOUNTER — Encounter (HOSPITAL_COMMUNITY): Payer: Self-pay | Admitting: Student

## 2021-06-28 NOTE — Progress Notes (Signed)
DUE TO COVID-19 ONLY ONE VISITOR IS ALLOWED TO COME WITH YOU AND STAY IN THE WAITING ROOM ONLY DURING PRE OP AND PROCEDURE DAY OF SURGERY.   PCP - none Cardiologist - n/a  Chest x-ray - 06/03/21 EKG - n/a Stress Test - n/a ECHO - n/a Cardiac Cath - n/a  Sleep Study -  n/a CPAP - none  Aspirin Instructions: Follow your surgeon's instructions on when to stop aspirin prior to surgery,  If no instructions were given by your surgeon then you will need to call the office for those instructions.  STOP now taking any Aspirin (unless otherwise instructed by your surgeon), Aleve, Naproxen, Ibuprofen, Motrin, Advil, Goody's, BC's, all herbal medications, fish oil, and all vitamins.   Coronavirus Screening Covid test n/a - [facility]  Do you have any of the following symptoms:  Cough yes/no: No Fever (>100.34F)  yes/no: No Runny nose yes/no: No Sore throat yes/no: No Difficulty breathing/shortness of breath  yes/no: No  Have you traveled in the last 14 days and where? yes/no: No  Patient verbalized understanding of instructions that were given via phone.

## 2021-06-29 ENCOUNTER — Ambulatory Visit (HOSPITAL_COMMUNITY)
Admission: RE | Admit: 2021-06-29 | Discharge: 2021-06-29 | Disposition: A | Discharge status: Home / Self Care | Payer: Commercial Managed Care - PPO | Attending: Student | Admitting: Student

## 2021-06-29 ENCOUNTER — Ambulatory Visit (HOSPITAL_COMMUNITY): Payer: Commercial Managed Care - PPO

## 2021-06-29 ENCOUNTER — Ambulatory Visit (HOSPITAL_COMMUNITY): Payer: Commercial Managed Care - PPO | Admitting: Registered Nurse

## 2021-06-29 ENCOUNTER — Encounter (HOSPITAL_COMMUNITY)
Admission: RE | Disposition: A | Discharge status: Home / Self Care | Payer: Self-pay | Source: Home / Self Care | Attending: Student

## 2021-06-29 ENCOUNTER — Other Ambulatory Visit: Payer: Self-pay

## 2021-06-29 ENCOUNTER — Encounter (HOSPITAL_COMMUNITY): Payer: Self-pay | Admitting: Student

## 2021-06-29 DIAGNOSIS — Z885 Allergy status to narcotic agent status: Secondary | ICD-10-CM | POA: Insufficient documentation

## 2021-06-29 DIAGNOSIS — T148XXA Other injury of unspecified body region, initial encounter: Secondary | ICD-10-CM

## 2021-06-29 DIAGNOSIS — S82871B Displaced pilon fracture of right tibia, initial encounter for open fracture type I or II: Secondary | ICD-10-CM | POA: Insufficient documentation

## 2021-06-29 DIAGNOSIS — Z419 Encounter for procedure for purposes other than remedying health state, unspecified: Secondary | ICD-10-CM

## 2021-06-29 DIAGNOSIS — S82201B Unspecified fracture of shaft of right tibia, initial encounter for open fracture type I or II: Secondary | ICD-10-CM | POA: Diagnosis not present

## 2021-06-29 DIAGNOSIS — Z87891 Personal history of nicotine dependence: Secondary | ICD-10-CM | POA: Diagnosis not present

## 2021-06-29 DIAGNOSIS — S82871C Displaced pilon fracture of right tibia, initial encounter for open fracture type IIIA, IIIB, or IIIC: Secondary | ICD-10-CM | POA: Insufficient documentation

## 2021-06-29 HISTORY — PX: OPEN REDUCTION INTERNAL FIXATION (ORIF) TIBIA/FIBULA FRACTURE: SHX5992

## 2021-06-29 HISTORY — PX: EXTERNAL FIXATION REMOVAL: SHX5040

## 2021-06-29 SURGERY — OPEN REDUCTION INTERNAL FIXATION (ORIF) TIBIA/FIBULA FRACTURE
Anesthesia: Regional | Site: Leg Lower | Laterality: Right

## 2021-06-29 MED ORDER — METHOCARBAMOL 500 MG PO TABS: 500.0000 mg | ORAL_TABLET | Freq: Four times a day (QID) | ORAL | 0 refills | Status: DC

## 2021-06-29 MED ORDER — LIDOCAINE 2% (20 MG/ML) 5 ML SYRINGE
INTRAMUSCULAR | Status: DC | PRN
  Administered 2021-06-29: 40 mg via INTRAVENOUS

## 2021-06-29 MED ORDER — LACTATED RINGERS IV SOLN: INTRAVENOUS | Status: DC

## 2021-06-29 MED ORDER — DEXAMETHASONE SODIUM PHOSPHATE 10 MG/ML IJ SOLN
INTRAMUSCULAR | Status: AC
  Filled 2021-06-29: qty 1

## 2021-06-29 MED ORDER — OXYCODONE HCL 5 MG PO TABS
5.0000 mg | ORAL_TABLET | Freq: Once | ORAL | Status: AC
Start: 2021-06-29 — End: 2021-06-29
  Administered 2021-06-29: 5 mg via ORAL

## 2021-06-29 MED ORDER — VANCOMYCIN HCL 1000 MG IV SOLR
INTRAVENOUS | Status: DC | PRN
  Administered 2021-06-29: 1000 mg

## 2021-06-29 MED ORDER — ESMOLOL HCL 100 MG/10ML IV SOLN
INTRAVENOUS | Status: DC | PRN
  Administered 2021-06-29: 10 mg via INTRAVENOUS

## 2021-06-29 MED ORDER — LIDOCAINE 2% (20 MG/ML) 5 ML SYRINGE
INTRAMUSCULAR | Status: AC
  Filled 2021-06-29: qty 5

## 2021-06-29 MED ORDER — PROPOFOL 10 MG/ML IV BOLUS
INTRAVENOUS | Status: AC
  Filled 2021-06-29: qty 20

## 2021-06-29 MED ORDER — TOBRAMYCIN SULFATE 1.2 G IJ SOLR
INTRAMUSCULAR | Status: DC | PRN
  Administered 2021-06-29: 1.2 g

## 2021-06-29 MED ORDER — CEFAZOLIN SODIUM-DEXTROSE 2-4 GM/100ML-% IV SOLN
2.0000 g | INTRAVENOUS | Status: AC
  Administered 2021-06-29: 2 g via INTRAVENOUS

## 2021-06-29 MED ORDER — FENTANYL CITRATE (PF) 100 MCG/2ML IJ SOLN: 25.0000 ug | INTRAMUSCULAR | Status: DC | PRN

## 2021-06-29 MED ORDER — ONDANSETRON HCL 4 MG/2ML IJ SOLN
INTRAMUSCULAR | Status: AC
  Filled 2021-06-29: qty 2

## 2021-06-29 MED ORDER — CEFAZOLIN SODIUM-DEXTROSE 2-4 GM/100ML-% IV SOLN
INTRAVENOUS | Status: AC
  Filled 2021-06-29: qty 100

## 2021-06-29 MED ORDER — FENTANYL CITRATE (PF) 100 MCG/2ML IJ SOLN: 100.0000 ug | Freq: Once | INTRAMUSCULAR | Status: AC

## 2021-06-29 MED ORDER — FENTANYL CITRATE (PF) 250 MCG/5ML IJ SOLN
INTRAMUSCULAR | Status: AC
  Filled 2021-06-29: qty 5

## 2021-06-29 MED ORDER — ORAL CARE MOUTH RINSE: 15.0000 mL | Freq: Once | OROMUCOSAL | Status: AC

## 2021-06-29 MED ORDER — FENTANYL CITRATE (PF) 250 MCG/5ML IJ SOLN
INTRAMUSCULAR | Status: DC | PRN
  Administered 2021-06-29: 50 ug via INTRAVENOUS
  Administered 2021-06-29: 100 ug via INTRAVENOUS
  Administered 2021-06-29 (×2): 50 ug via INTRAVENOUS

## 2021-06-29 MED ORDER — DEXMEDETOMIDINE (PRECEDEX) IN NS 20 MCG/5ML (4 MCG/ML) IV SYRINGE
PREFILLED_SYRINGE | INTRAVENOUS | Status: DC | PRN
  Administered 2021-06-29 (×2): 8 ug via INTRAVENOUS
  Administered 2021-06-29: 4 ug via INTRAVENOUS

## 2021-06-29 MED ORDER — 0.9 % SODIUM CHLORIDE (POUR BTL) OPTIME
TOPICAL | Status: DC | PRN
  Administered 2021-06-29: 1000 mL

## 2021-06-29 MED ORDER — ACETAMINOPHEN 500 MG PO TABS
1000.0000 mg | ORAL_TABLET | Freq: Once | ORAL | Status: AC
  Administered 2021-06-29: 1000 mg via ORAL
  Filled 2021-06-29: qty 2

## 2021-06-29 MED ORDER — FENTANYL CITRATE (PF) 100 MCG/2ML IJ SOLN
INTRAMUSCULAR | Status: AC
  Administered 2021-06-29: 100 ug via INTRAVENOUS
  Filled 2021-06-29: qty 2

## 2021-06-29 MED ORDER — MIDAZOLAM HCL 2 MG/2ML IJ SOLN: 2.0000 mg | Freq: Once | INTRAMUSCULAR | Status: AC

## 2021-06-29 MED ORDER — CHLORHEXIDINE GLUCONATE 0.12 % MT SOLN
OROMUCOSAL | Status: AC
  Administered 2021-06-29: 15 mL via OROMUCOSAL
  Filled 2021-06-29: qty 15

## 2021-06-29 MED ORDER — PROPOFOL 10 MG/ML IV BOLUS
INTRAVENOUS | Status: DC | PRN
  Administered 2021-06-29: 200 mg via INTRAVENOUS

## 2021-06-29 MED ORDER — CHLORHEXIDINE GLUCONATE 0.12 % MT SOLN: 15.0000 mL | Freq: Once | OROMUCOSAL | Status: AC

## 2021-06-29 MED ORDER — DEXAMETHASONE SODIUM PHOSPHATE 10 MG/ML IJ SOLN
INTRAMUSCULAR | Status: DC | PRN
  Administered 2021-06-29: 4 mg via INTRAVENOUS

## 2021-06-29 MED ORDER — DEXMEDETOMIDINE (PRECEDEX) IN NS 20 MCG/5ML (4 MCG/ML) IV SYRINGE
PREFILLED_SYRINGE | INTRAVENOUS | Status: AC
  Filled 2021-06-29: qty 5

## 2021-06-29 MED ORDER — ROPIVACAINE HCL 5 MG/ML IJ SOLN
INTRAMUSCULAR | Status: DC | PRN
  Administered 2021-06-29: 30 mL via PERINEURAL
  Administered 2021-06-29: 15 mL via PERINEURAL

## 2021-06-29 MED ORDER — OXYCODONE HCL 5 MG PO TABS: 5.0000 mg | ORAL_TABLET | ORAL | 0 refills | Status: DC | PRN

## 2021-06-29 MED ORDER — TOBRAMYCIN SULFATE 1.2 G IJ SOLR
INTRAMUSCULAR | Status: AC
  Filled 2021-06-29: qty 1.2

## 2021-06-29 MED ORDER — MIDAZOLAM HCL 2 MG/2ML IJ SOLN
INTRAMUSCULAR | Status: AC
  Administered 2021-06-29: 2 mg via INTRAVENOUS
  Filled 2021-06-29: qty 2

## 2021-06-29 MED ORDER — OXYCODONE HCL 5 MG PO TABS
ORAL_TABLET | ORAL | Status: AC
  Filled 2021-06-29: qty 1

## 2021-06-29 MED ORDER — ESMOLOL HCL 100 MG/10ML IV SOLN
INTRAVENOUS | Status: AC
  Filled 2021-06-29: qty 10

## 2021-06-29 MED ORDER — ONDANSETRON HCL 4 MG/2ML IJ SOLN
INTRAMUSCULAR | Status: DC | PRN
Start: 2021-06-29 — End: 2021-06-29
  Administered 2021-06-29 (×2): 4 mg via INTRAVENOUS

## 2021-06-29 MED ORDER — DEXAMETHASONE SODIUM PHOSPHATE 10 MG/ML IJ SOLN
INTRAMUSCULAR | Status: DC | PRN
  Administered 2021-06-29 (×2): 5 mg

## 2021-06-29 MED ORDER — ASPIRIN 325 MG PO TBEC: 325.0000 mg | DELAYED_RELEASE_TABLET | Freq: Two times a day (BID) | ORAL | 0 refills | Status: AC

## 2021-06-29 MED ORDER — VANCOMYCIN HCL 1000 MG IV SOLR
INTRAVENOUS | Status: AC
  Filled 2021-06-29: qty 1000

## 2021-06-29 SURGICAL SUPPLY — 84 items
BAG COUNTER SPONGE SURGICOUNT (BAG) ×2 IMPLANT
BANDAGE ESMARK 6X9 LF (GAUZE/BANDAGES/DRESSINGS) ×1 IMPLANT
BIT DRILL 2.0 (BIT) ×1
BIT DRILL 2.5X2.75 QC CALB (BIT) ×2 IMPLANT
BIT DRILL 2XNS DISP SS SM FRAG (BIT) ×1 IMPLANT
BIT DRILL CALIBRATED 2.7 (BIT) ×2 IMPLANT
BIT DRL 2XNS DISP SS SM FRAG (BIT) ×1
BLADE 15 SAFETY STRL DISP (BLADE) ×2 IMPLANT
BNDG COHESIVE 4X5 TAN STRL (GAUZE/BANDAGES/DRESSINGS) ×2 IMPLANT
BNDG ELASTIC 4X5.8 VLCR STR LF (GAUZE/BANDAGES/DRESSINGS) ×2 IMPLANT
BNDG ELASTIC 6X5.8 VLCR STR LF (GAUZE/BANDAGES/DRESSINGS) ×2 IMPLANT
BNDG ESMARK 6X9 LF (GAUZE/BANDAGES/DRESSINGS) ×2
BRUSH SCRUB EZ PLAIN DRY (MISCELLANEOUS) ×4 IMPLANT
CHIPS CORTICOCANC 20CC CC1/4 (Bone Implant) ×2 IMPLANT
CHLORAPREP W/TINT 26 (MISCELLANEOUS) ×2 IMPLANT
COVER SURGICAL LIGHT HANDLE (MISCELLANEOUS) ×4 IMPLANT
DRAPE C-ARM 42X72 X-RAY (DRAPES) ×2 IMPLANT
DRAPE C-ARMOR (DRAPES) ×2 IMPLANT
DRAPE ORTHO SPLIT 77X108 STRL (DRAPES) ×2
DRAPE SURG ORHT 6 SPLT 77X108 (DRAPES) ×2 IMPLANT
DRAPE U-SHAPE 47X51 STRL (DRAPES) ×2 IMPLANT
DRSG ADAPTIC 3X8 NADH LF (GAUZE/BANDAGES/DRESSINGS) IMPLANT
DRSG MEPITEL 4X7.2 (GAUZE/BANDAGES/DRESSINGS) ×2 IMPLANT
DRSG PAD ABDOMINAL 8X10 ST (GAUZE/BANDAGES/DRESSINGS) ×2 IMPLANT
ELECT REM PT RETURN 9FT ADLT (ELECTROSURGICAL) ×2
ELECTRODE REM PT RTRN 9FT ADLT (ELECTROSURGICAL) ×1 IMPLANT
GAUZE SPONGE 4X4 12PLY STRL (GAUZE/BANDAGES/DRESSINGS) ×4 IMPLANT
GLOVE SURG ENC MOIS LTX SZ6.5 (GLOVE) ×6 IMPLANT
GLOVE SURG ENC MOIS LTX SZ7.5 (GLOVE) ×6 IMPLANT
GLOVE SURG PR MICRO ENCORE 7 (GLOVE) ×2 IMPLANT
GLOVE SURG UNDER POLY LF SZ6.5 (GLOVE) ×2 IMPLANT
GLOVE SURG UNDER POLY LF SZ7.5 (GLOVE) ×2 IMPLANT
GOWN STRL REUS W/ TWL LRG LVL3 (GOWN DISPOSABLE) ×2 IMPLANT
GOWN STRL REUS W/TWL LRG LVL3 (GOWN DISPOSABLE) ×2
K-WIRE ACE 1.6X6 (WIRE) ×8
KIT TURNOVER KIT B (KITS) ×2 IMPLANT
KWIRE ACE 1.6X6 (WIRE) ×4 IMPLANT
MANIFOLD NEPTUNE II (INSTRUMENTS) ×2 IMPLANT
NEEDLE HYPO 21X1.5 SAFETY (NEEDLE) IMPLANT
NEEDLE HYPO 25GX1X1/2 BEV (NEEDLE) ×2 IMPLANT
NS IRRIG 1000ML POUR BTL (IV SOLUTION) ×2 IMPLANT
PACK TOTAL JOINT (CUSTOM PROCEDURE TRAY) ×2 IMPLANT
PAD ARMBOARD 7.5X6 YLW CONV (MISCELLANEOUS) ×4 IMPLANT
PAD CAST 3X4 CTTN HI CHSV (CAST SUPPLIES) ×1 IMPLANT
PAD CAST 4YDX4 CTTN HI CHSV (CAST SUPPLIES) IMPLANT
PADDING CAST COTTON 3X4 STRL (CAST SUPPLIES) ×1
PADDING CAST COTTON 4X4 STRL (CAST SUPPLIES)
PADDING CAST COTTON 6X4 STRL (CAST SUPPLIES) ×2 IMPLANT
PLATE 12H RT DIST ANTLAT TIB (Plate) ×2 IMPLANT
PLATE FIBULAR COMP LOCK 10H (Plate) ×2 IMPLANT
SCREW CORTICAL 2.7MM  42MM (Screw) ×1 IMPLANT
SCREW CORTICAL 2.7MM  48MM (Screw) ×1 IMPLANT
SCREW CORTICAL 2.7MM 42MM (Screw) ×1 IMPLANT
SCREW CORTICAL 2.7MM 48MM (Screw) ×1 IMPLANT
SCREW CORTICAL 2.7MM 50MM (Screw) ×2 IMPLANT
SCREW CORTICAL 3.5MM  44MM (Screw) ×1 IMPLANT
SCREW CORTICAL 3.5MM 38MM (Screw) ×2 IMPLANT
SCREW CORTICAL 3.5MM 40MM (Screw) ×4 IMPLANT
SCREW CORTICAL 3.5MM 44MM (Screw) ×1 IMPLANT
SCREW CORTICAL LOW PROF 3.5X32 (Screw) ×2 IMPLANT
SCREW LOCK CORT STAR 3.5X12 (Screw) ×2 IMPLANT
SCREW LOCK CORT STAR 3.5X20 (Screw) ×2 IMPLANT
SCREW LOCK CORT STAR 3.5X42 (Screw) ×2 IMPLANT
SCREW LOCK CORT STAR 3.5X46 (Screw) ×2 IMPLANT
SCREW LOCK CORT STAR 3.5X50 (Screw) ×4 IMPLANT
SCREW LOW PROFILE 3.5X30MM TIS (Screw) ×4 IMPLANT
SCREW T15 LP CORT 3.5X50MM NS (Screw) ×2 IMPLANT
SPONGE T-LAP 18X18 ~~LOC~~+RFID (SPONGE) ×2 IMPLANT
STAPLER VISISTAT 35W (STAPLE) ×2 IMPLANT
SUCTION FRAZIER HANDLE 10FR (MISCELLANEOUS) ×1
SUCTION TUBE FRAZIER 10FR DISP (MISCELLANEOUS) ×1 IMPLANT
SUT ETHILON 3 0 PS 1 (SUTURE) ×8 IMPLANT
SUT MON AB 2-0 CT1 36 (SUTURE) ×2 IMPLANT
SUT PROLENE 0 CT (SUTURE) IMPLANT
SUT VIC AB 0 CT1 27 (SUTURE) ×2
SUT VIC AB 0 CT1 27XBRD ANBCTR (SUTURE) ×2 IMPLANT
SUT VIC AB 2-0 CT1 27 (SUTURE) ×2
SUT VIC AB 2-0 CT1 TAPERPNT 27 (SUTURE) ×2 IMPLANT
SYR CONTROL 10ML LL (SYRINGE) ×2 IMPLANT
TOWEL GREEN STERILE (TOWEL DISPOSABLE) ×4 IMPLANT
TOWEL GREEN STERILE FF (TOWEL DISPOSABLE) ×2 IMPLANT
UNDERPAD 30X36 HEAVY ABSORB (UNDERPADS AND DIAPERS) ×2 IMPLANT
WATER STERILE IRR 1000ML POUR (IV SOLUTION) ×2 IMPLANT
YANKAUER SUCT BULB TIP NO VENT (SUCTIONS) ×2 IMPLANT

## 2021-06-29 NOTE — Anesthesia Procedure Notes (Signed)
Anesthesia Regional Block: Adductor canal block   Pre-Anesthetic Checklist: , timeout performed,  Correct Patient, Correct Site, Correct Laterality,  Correct Procedure, Correct Position, site marked,  Risks and benefits discussed,  Surgical consent,  Pre-op evaluation,  At surgeon's request and post-op pain management  Laterality: Right  Prep: Maximum Sterile Barrier Precautions used, chloraprep       Needles:  Injection technique: Single-shot  Needle Type: Echogenic Stimulator Needle     Needle Length: 9cm  Needle Gauge: 22     Additional Needles:   Procedures:,,,, ultrasound used (permanent image in chart),,    Narrative:  Start time: 06/29/2021 9:16 AM End time: 06/29/2021 9:19 AM Injection made incrementally with aspirations every 5 mL.  Performed by: Personally  Anesthesiologist: Aaron Picker, MD  Additional Notes: Monitors applied. No increased pain on injection. No increased resistance to injection. Injection made in 5cc increments. Good needle visualization. Patient tolerated procedure well.

## 2021-06-29 NOTE — Transfer of Care (Signed)
Immediate Anesthesia Transfer of Care Note  Patient: Aaron Webster  Procedure(s) Performed: OPEN REDUCTION INTERNAL FIXATION (ORIF) PILON FRACTURE (Right: Leg Lower) REMOVAL EXTERNAL FIXATION LEG (Right: Leg Lower)  Patient Location: PACU  Anesthesia Type:GA combined with regional for post-op pain  Level of Consciousness: drowsy and patient cooperative  Airway & Oxygen Therapy: Patient Spontanous Breathing  Post-op Assessment: Report given to RN and Post -op Vital signs reviewed and stable  Post vital signs: Reviewed and stable  Last Vitals:  Vitals Value Taken Time  BP    Temp    Pulse    Resp    SpO2      Last Pain:  Vitals:   06/29/21 0843  TempSrc:   PainSc: 3          Complications: No notable events documented.
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
## 2021-06-29 NOTE — Discharge Instructions (Signed)
Truitt Merle, MD Ulyses Southward PA-C [facility] [address] [phone] Jani Files)   [phone] (fax)                                  POST-OPERATIVE INSTRUCTIONS     WEIGHT BEARING STATUS: non-weightbearing right lower extremity  RANGE OF MOTION/ACTIVITY: ok for knee motion, don't remove splint  WOUND CARE Please keep splint clean dry and intact until follow-up. If your splint gets wet for any reason please contact the office immediately.  Do not stick anything down your splint such as pencils, momey, hangers to try and scratch yourself.  If you feel itchy take Benadryl as prescribed on the bottle for itching You may shower on Post-Op Day #2.  You must keep splint dry during this process and may find that a plastic bag taped around the extremity or alternatively a towel based bath may be a better option.   If you get your splint wet or if it is damaged please contact our clinic.  EXERCISES Due to your splint being in place you will not be able to bear weight through your extremity.   DO NOT PUT ANY WEIGHT ON YOUR OPERATIVE LEG Please use crutches or a walker to avoid weight bearing.   DVT/PE prophylaxis: Aspirin twice daily x 30 days  DIET: As you were eating previously.  Can use over the counter stool softeners and bowel preparations, such as Miralax, to help with bowel movements.  Narcotics can be constipating.  Be sure to drink plenty of fluids  REGIONAL ANESTHESIA (NERVE BLOCKS) The anesthesia team may have performed a nerve block for you if safe in the setting of your care.  This is a great tool used to minimize pain.  Typically the block may start wearing off overnight but the long acting medicine may last for 3-4 days.  The nerve block wearing off can be a challenging period but please utilize your as needed pain medications to try and manage this period.    POST-OP MEDICATIONS- Multimodal approach to pain control  In general your pain will be  controlled with a combination of substances.  Prescriptions unless otherwise discussed are electronically sent to your pharmacy.  This is a carefully made plan we use to minimize narcotic use.     - Meloxicam OR Celebrex - Anti-inflammatory medication taken on a scheduled basis  - Acetaminophen - Non-narcotic pain medicine taken on a scheduled basis   - Oxycodone - This is a strong narcotic, to be used only on an "as needed" basis for pain.  -  Aspirin 81mg  - This medicine is used to minimize the risk of blood clots after surgery.             -          Zofran - take as needed for nausea   FOLLOW-UP If you develop a Fever (>101.5), Redness or Drainage from the surgical incision site, please call our office to arrange for an evaluation. Please call the office to schedule a follow-up appointment for your incision check if you do not already have one, 7-10 days post-operatively.   VISIT OUR WEBSITE FOR ADDITIONAL INFORMATION: [email or web address]   HELPFUL INFORMATION  If you had a block, it will wear off between 8-24 hrs postop typically.  This is period when your pain may go from nearly zero to the pain you would have had postop without  the block.  This is an abrupt transition but nothing dangerous is happening.  You may take an extra dose of narcotic when this happens.  You should wean off your narcotic medicines as soon as you are able.  Most patients will be off or using minimal narcotics before their first postop appointment.   We suggest you use the pain medication the first night prior to going to bed, in order to ease any pain when the anesthesia wears off. You should avoid taking pain medications on an empty stomach as it will make you nauseous.  Do not drink alcoholic beverages or take illicit drugs when taking pain medications.  In most states it is against the law to drive while you are in a splint or sling.  And certainly against the law to drive while taking narcotics.  You may  return to work/school in the next couple of days when you feel up to it.   Pain medication may make you constipated.  Below are a few solutions to try in this order: Decrease the amount of pain medication if you aren't having pain. Drink lots of decaffeinated fluids. Drink prune juice and/or each dried prunes  If the first 3 don't work start with additional solutions Take Colace - an over-the-counter stool softener Take Senokot - an over-the-counter laxative Take Miralax - a stronger over-the-counter laxative

## 2021-06-29 NOTE — Anesthesia Procedure Notes (Signed)
Procedure Name: LMA Insertion Date/Time: 06/29/2021 11:23 AM Performed by: Adria Dill, CRNA Pre-anesthesia Checklist: Patient identified, Emergency Drugs available, Suction available and Patient being monitored Patient Re-evaluated:Patient Re-evaluated prior to induction Oxygen Delivery Method: Circle system utilized Preoxygenation: Pre-oxygenation with 100% oxygen Induction Type: IV induction LMA: LMA inserted LMA Size: 4.0 Number of attempts: 2 Placement Confirmation: positive ETCO2 and breath sounds checked- equal and bilateral Tube secured with: Tape Dental Injury: Teeth and Oropharynx as per pre-operative assessment

## 2021-06-29 NOTE — Interval H&P Note (Signed)
History and Physical Interval Note:  06/29/2021 10:46 AM  Aaron Webster  has presented today for surgery, with the diagnosis of Right pilon fracture.  The various methods of treatment have been discussed with the patient and family. After consideration of risks, benefits and other options for treatment, the patient has consented to  Procedure(s): OPEN REDUCTION INTERNAL FIXATION (ORIF) PILON FRACTURE (Right) as a surgical intervention.  The patient's history has been reviewed, patient examined, no change in status, stable for surgery.  I have reviewed the patient's chart and labs.  Questions were answered to the patient's satisfaction.     Caryn Bee P Veronica Guerrant
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
## 2021-06-29 NOTE — Anesthesia Procedure Notes (Signed)
Anesthesia Regional Block: Popliteal block   Pre-Anesthetic Checklist: , timeout performed,  Correct Patient, Correct Site, Correct Laterality,  Correct Procedure, Correct Position, site marked,  Risks and benefits discussed,  Surgical consent,  Pre-op evaluation,  At surgeon's request and post-op pain management  Laterality: Right  Prep: Maximum Sterile Barrier Precautions used, chloraprep       Needles:  Injection technique: Single-shot  Needle Type: Echogenic Stimulator Needle     Needle Length: 9cm  Needle Gauge: 22     Additional Needles:   Procedures:,,,, ultrasound used (permanent image in chart),,    Narrative:  Start time: 06/29/2021 9:10 AM End time: 06/29/2021 9:16 AM Injection made incrementally with aspirations every 5 mL.  Performed by: Personally  Anesthesiologist: Elmer Picker, MD  Additional Notes: Monitors applied. No increased pain on injection. No increased resistance to injection. Injection made in 5cc increments. Good needle visualization. Patient tolerated procedure well.

## 2021-06-30 ENCOUNTER — Encounter (HOSPITAL_COMMUNITY): Payer: Self-pay | Admitting: Student

## 2021-07-04 NOTE — Anesthesia Postprocedure Evaluation (Signed)
Anesthesia Post Note  Patient: Yavier Snider  Procedure(s) Performed: OPEN REDUCTION INTERNAL FIXATION (ORIF) PILON FRACTURE (Right: Leg Lower) REMOVAL EXTERNAL FIXATION LEG (Right: Leg Lower)     Patient location during evaluation: PACU Anesthesia Type: Regional and General Level of consciousness: awake and alert Pain management: pain level controlled Vital Signs Assessment: post-procedure vital signs reviewed and stable Respiratory status: spontaneous breathing, nonlabored ventilation and respiratory function stable Cardiovascular status: blood pressure returned to baseline and stable Postop Assessment: no apparent nausea or vomiting Anesthetic complications: no   No notable events documented.  Last Vitals:  Vitals:   06/29/21 1645 06/29/21 1700  BP: 116/78 120/80  Pulse: 91 83  Resp: 13 14  Temp:  36.6 C  SpO2: 97% 98%    Last Pain:  Vitals:   06/29/21 1548  TempSrc:   PainSc: 0-No pain                 Meghanne Pletz,W. EDMOND

## 2021-09-12 ENCOUNTER — Other Ambulatory Visit: Payer: Self-pay

## 2021-09-12 ENCOUNTER — Ambulatory Visit (HOSPITAL_COMMUNITY): Payer: Commercial Managed Care - PPO | Attending: Student | Admitting: Physical Therapy

## 2021-09-12 ENCOUNTER — Encounter (HOSPITAL_COMMUNITY): Payer: Self-pay | Admitting: Physical Therapy

## 2021-09-12 DIAGNOSIS — M79661 Pain in right lower leg: Secondary | ICD-10-CM

## 2021-09-12 DIAGNOSIS — R2689 Other abnormalities of gait and mobility: Secondary | ICD-10-CM | POA: Diagnosis present

## 2021-09-12 DIAGNOSIS — R262 Difficulty in walking, not elsewhere classified: Secondary | ICD-10-CM | POA: Diagnosis present

## 2021-09-12 NOTE — Patient Instructions (Signed)
Access Code: [email or web address] URL: [email or web address] Date: 09/12/2021 Prepared by: Georges Lynch  Exercises Ankle Eversion with Resistance - 3 x daily - 7 x weekly - 3 sets - 10 reps Ankle and Toe Plantarflexion with Resistance - 3 x daily - 7 x weekly - 3 sets - 10 reps Ankle Inversion with Resistance - 3 x daily - 7 x weekly - 3 sets - 10 reps Small Range Straight Leg Raise - 3 x daily - 7 x weekly - 3 sets - 10 reps Supine Bridge - 3 x daily - 7 x weekly - 3 sets - 10 reps Seated Calf Stretch with Strap - 3 x daily - 7 x weekly - 1 sets - 3 reps - 30 second hold
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
## 2021-09-14 ENCOUNTER — Ambulatory Visit (HOSPITAL_COMMUNITY): Payer: Commercial Managed Care - PPO

## 2021-09-14 ENCOUNTER — Encounter (HOSPITAL_COMMUNITY): Payer: Self-pay

## 2021-09-14 ENCOUNTER — Other Ambulatory Visit: Payer: Self-pay

## 2021-09-14 DIAGNOSIS — R2689 Other abnormalities of gait and mobility: Secondary | ICD-10-CM

## 2021-09-14 DIAGNOSIS — R262 Difficulty in walking, not elsewhere classified: Secondary | ICD-10-CM

## 2021-09-14 DIAGNOSIS — M79661 Pain in right lower leg: Secondary | ICD-10-CM | POA: Diagnosis not present

## 2021-09-14 NOTE — Therapy (Signed)
[facility] [address] [place], [place], [address] Phone: [phone]   Fax:  [phone]  Physical Therapy Treatment  Patient Details  Name: Aaron Webster MRN: [id number] Date of Birth: [birth year] Referring Provider (PT): Truitt Merle MD   Encounter Date: 09/14/2021   PT End of Session - 09/14/21 1409     Visit Number 2    Number of Visits 24    Date for PT Re-Evaluation 12/05/21    Authorization Type UMR/ [employer]    PT Start Time 1405    PT Stop Time 1445    PT Time Calculation (min) 40 min    Activity Tolerance Patient tolerated treatment well    Behavior During Therapy [facility] for tasks assessed/performed             History reviewed. No pertinent past medical history.  Past Surgical History:  Procedure Laterality Date   EXTERNAL FIXATION LEG Right 06/02/2021   Procedure: EXTERNAL FIXATION LEG;  Surgeon: Roby Lofts, MD;  Location: [facility] OR;  Service: Orthopedics;  Laterality: Right;   EXTERNAL FIXATION REMOVAL Right 06/29/2021   Procedure: REMOVAL EXTERNAL FIXATION LEG;  Surgeon: Roby Lofts, MD;  Location: [facility] OR;  Service: Orthopedics;  Laterality: Right;   I & D EXTREMITY Right 06/02/2021   Procedure: IRRIGATION AND DEBRIDEMENT EXTREMITY;  Surgeon: Roby Lofts, MD;  Location: [facility] OR;  Service: Orthopedics;  Laterality: Right;   LAPAROTOMY N/A 06/02/2021   Procedure: EXPLORATORY LAPAROTOMY;  Surgeon: Violeta Gelinas, MD;  Location: [facility] OR;  Service: General;  Laterality: N/A;   OPEN REDUCTION INTERNAL FIXATION (ORIF) TIBIA/FIBULA FRACTURE Right 06/29/2021   Procedure: OPEN REDUCTION INTERNAL FIXATION (ORIF) PILON FRACTURE;  Surgeon: Roby Lofts, MD;  Location: [facility] OR;  Service: Orthopedics;  Laterality: Right;    There were no vitals filed for this visit.   Subjective Assessment - 09/14/21 1407     Subjective Pt reports a fall Wednesday at [employer] and reports a popping at big toe.  Arrived walking with RW into dept.     Pertinent History RT tibia ORIF 06/29/21    Currently in Pain? No/denies                [facility] PT Assessment - 09/14/21 0001       Assessment   Next MD Visit 10/03/21      Restrictions   Weight Bearing Restrictions Yes    LLE Weight Bearing Weight bearing as tolerated   WBAT in CAM boot for 2 weeks then progress out of boot                          [facility] Adult PT Treatment/Exercise - 09/14/21 0001       Exercises   Exercises Ankle      Knee/Hip Exercises: Stretches   Gastroc Stretch 2 reps;30 seconds    Gastroc Stretch Limitations towel assistance    Other Knee/Hip Stretches Great toe PROM extend and flex      Manual Therapy   Manual Therapy Edema management;Passive ROM    Manual therapy comments Manual complete separate than rest of tx    Edema Management Retrograde massage for edema control with LE elevated    Passive ROM PROM all directions with 10" holds in pain free range      Ankle Exercises: Stretches   Gastroc Stretch 2 reps;30 seconds    Gastroc Stretch Limitations long sit stretch with towel assistance  Other Stretch Great toe flexion/extension PROM      Ankle Exercises: Seated   Ankle Circles/Pumps 10 reps   CW/CCW with LE elevated   Towel Crunch 1 rep   decreased great toe movement   Marble Pickup 10x    BAPS Sitting;Level 2;15 reps   DF/PF; Inv/Ev; CW/ CCW   BAPS Limitations cueing to reduce knee compensation                     PT Education - 09/14/21 1415     Education Details Reviewed goals, educated importance of HEP compliance for maximal benefits, reviewed RICE techniques for edema and pain control.    Person(s) Educated Patient    Methods Explanation    Comprehension Verbalized understanding;Returned demonstration              PT Short Term Goals - 09/12/21 1630       PT SHORT TERM GOAL #1   Title Patient will be independent with initial HEP and self-management strategies to improve functional  outcomes    Time 4    Period Weeks    Status New    Target Date 10/10/21      PT SHORT TERM GOAL #2   Title Patient will report at least 50% overall improvement in subjective complaint to indicate improvement in ability to perform ADLs.    Time 4    Period Weeks    Status New    Target Date 10/10/21      PT SHORT TERM GOAL #3   Title Patient will improve RT ankle DF AROM to at least 5 degrees for improved functional mobility and normailized gait mechanics.    Time 4    Period Weeks    Status New    Target Date 10/10/21               PT Long Term Goals - 09/12/21 1632       PT LONG TERM GOAL #1   Title Patient will improve RT ankle DF AROM to at least 10 degrees for improved functional mobility and normailized gait mechanics.    Time 12    Period Weeks    Status New    Target Date 12/05/21      PT LONG TERM GOAL #2   Title Patient will improve FOTO score to predicted value to indicate improvement in functional outcomes    Time 12    Period Weeks    Status New    Target Date 12/05/21      PT LONG TERM GOAL #3   Title Patient will report at least 75% overall improvement in subjective complaint to indicate improvement in ability to perform ADLs.    Time 12    Period Weeks    Status New    Target Date 12/05/21      PT LONG TERM GOAL #4   Title Patient will have equal to 5/5 MMT throughout RT ankle to improve ability to perform functional mobility, stair ambulation and ADLs.    Time 12    Period Weeks    Status New    Target Date 12/05/21                   Plan - 09/14/21 1417     Clinical Impression Statement Reviewed goals, educated importance of HEP compliance for maximal benefits, educated benefit with RICE technqiues for edema and pain control.  Manual techniques including retrograde massage and PROM for ankle  mobility.  Added seated intrinsic strenghtening and ankle mobility exercises that were tolerated well.  Pt with increased difficulty with  great toe flexion based exercises.  No reports of increased pain through session.    Examination-Activity Limitations Locomotion Level;Stand;Lift;Transfers;Stairs    Examination-Participation Restrictions Community Activity;Occupation;Cleaning;Driving;Yard Work;Shop    Stability/Clinical Decision Making Stable/Uncomplicated    Clinical Decision Making Low    Rehab Potential Good    PT Frequency 2x / week    PT Duration 12 weeks    PT Treatment/Interventions ADLs/Self Care Home Management;Biofeedback;Traction;Moist Heat;Stair training;Functional mobility training;Neuromuscular re-education;Manual techniques;Passive range of motion;Dry needling;Energy conservation;Manual lymph drainage;Patient/family education;Therapeutic activities;Ultrasound;Cryotherapy;Parrafin;Fluidtherapy;Splinting;Spinal Manipulations;Visual/perceptual remediation/compensation;Vasopneumatic Device;Scar mobilization;Compression bandaging;Taping;Joint Manipulations;Orthotic Fit/Training;Balance training;Therapeutic exercise;Contrast Bath;DME Instruction;Electrical Stimulation;Iontophoresis 4mg /ml Dexamethasone;Gait training    PT Next Visit Plan Next session: Begin weight shifting and gait with axillary crutch (patient does not have these as of evaluation) in boot.  Add towel inversion/eversion, continue marble pickup, BAPS L2 possibly L3 next session.  Progress ankle ROM and LE strengthening. Ankle mobility, BAPs , towel scrunch. Manual PROM and mobilizations as needed.    PT Home Exercise Plan Eval: Ankle band 3 way, seated calf stretch, SLR, bridge; 09/14/21: towel crunch, ankle circles, gastroc stretch, great toe movements, encouraged to elevate for edema control.    Consulted and Agree with Plan of Care Patient             Patient will benefit from skilled therapeutic intervention in order to improve the following deficits and impairments:  Pain, Improper body mechanics, Increased fascial restricitons, Abnormal gait,  Decreased balance, Difficulty walking, Increased edema, Impaired flexibility, Hypomobility, Decreased strength, Decreased range of motion, Decreased activity tolerance, Decreased mobility, Decreased scar mobility  Visit Diagnosis: Pain in right lower leg  Other abnormalities of gait and mobility  Difficulty in walking, not elsewhere classified     Problem List Patient Active Problem List   Diagnosis Date Noted   Open pilon fracture, right, type III, initial encounter 06/14/2021   S/P small bowel resection 06/02/2021   06/04/2021, LPTA/CLT; CBIS [phone]  [phone], PTA 09/14/2021, 4:26 PM  [facility] [address] [place], [place], [place] Phone: [phone]   Fax:  [phone]  Name: Aaron Webster MRN: [place] Date of Birth: [birth year]

## 2021-09-19 ENCOUNTER — Ambulatory Visit (HOSPITAL_COMMUNITY): Payer: Commercial Managed Care - PPO

## 2021-09-19 ENCOUNTER — Other Ambulatory Visit: Payer: Self-pay

## 2021-09-19 ENCOUNTER — Encounter (HOSPITAL_COMMUNITY): Payer: Self-pay

## 2021-09-19 DIAGNOSIS — R262 Difficulty in walking, not elsewhere classified: Secondary | ICD-10-CM

## 2021-09-19 DIAGNOSIS — M79661 Pain in right lower leg: Secondary | ICD-10-CM | POA: Diagnosis not present

## 2021-09-19 DIAGNOSIS — R2689 Other abnormalities of gait and mobility: Secondary | ICD-10-CM

## 2021-09-19 NOTE — Therapy (Signed)
[facility] [address] [place], [place], [address] Phone: [phone]   Fax:  [phone]  Physical Therapy Treatment  Patient Details  Name: Aaron Webster MRN: [id number] Date of Birth: [birth year] Referring Provider (PT): Truitt Merle MD   Encounter Date: 09/19/2021   PT End of Session - 09/19/21 1756     Visit Number 3    Number of Visits 24    Date for PT Re-Evaluation 12/05/21    Authorization Type UMR/ [facility]    PT Start Time 1747    PT Stop Time 1830    PT Time Calculation (min) 43 min    Activity Tolerance Patient tolerated treatment well    Behavior During Therapy [facility] for tasks assessed/performed             History reviewed. No pertinent past medical history.  Past Surgical History:  Procedure Laterality Date   EXTERNAL FIXATION LEG Right 06/02/2021   Procedure: EXTERNAL FIXATION LEG;  Surgeon: Roby Lofts, MD;  Location: [facility] OR;  Service: Orthopedics;  Laterality: Right;   EXTERNAL FIXATION REMOVAL Right 06/29/2021   Procedure: REMOVAL EXTERNAL FIXATION LEG;  Surgeon: Roby Lofts, MD;  Location: [facility] OR;  Service: Orthopedics;  Laterality: Right;   I & D EXTREMITY Right 06/02/2021   Procedure: IRRIGATION AND DEBRIDEMENT EXTREMITY;  Surgeon: Roby Lofts, MD;  Location: [facility] OR;  Service: Orthopedics;  Laterality: Right;   LAPAROTOMY N/A 06/02/2021   Procedure: EXPLORATORY LAPAROTOMY;  Surgeon: Violeta Gelinas, MD;  Location: [facility] OR;  Service: General;  Laterality: N/A;   OPEN REDUCTION INTERNAL FIXATION (ORIF) TIBIA/FIBULA FRACTURE Right 06/29/2021   Procedure: OPEN REDUCTION INTERNAL FIXATION (ORIF) PILON FRACTURE;  Surgeon: Roby Lofts, MD;  Location: [facility] OR;  Service: Orthopedics;  Laterality: Right;    There were no vitals filed for this visit.   Subjective Assessment - 09/19/21 1755     Subjective Stated he purchased crutches and has been practicing at home.  Arrived in CAM boot and scooter today.     Pertinent History RT tibia ORIF 06/29/21    Currently in Pain? No/denies                [facility] PT Assessment - 09/19/21 0001       Assessment   Medical Diagnosis RT tibia ORIF    Referring Provider (PT) Truitt Merle MD    Next MD Visit 10/03/21      Restrictions   Weight Bearing Restrictions Yes    LLE Weight Bearing Weight bearing as tolerated                           [facility] Adult PT Treatment/Exercise - 09/19/21 0001       Exercises   Exercises Ankle      Knee/Hip Exercises: Stretches   Other Knee/Hip Stretches Great toe PROM extend and flex      Knee/Hip Exercises: Standing   Gait Training 47ft with crutches, cueing for sequence    Other Standing Knee Exercises lateral weight shifting wiht Rt foot on foam, lateral, rotate and forward (heel to toe mechanics)      Manual Therapy   Manual Therapy Other (comment)    Manual therapy comments Manual complete separate than rest of tx    Other Manual Therapy Measurements made for knee high compression garments, handout given      Ankle Exercises: Stretches   Other Stretch Great toe flexion/extension PROM  Ankle Exercises: Seated   Towel Crunch 1 rep   decreased great toe movement   Marble Pickup 10x    Heel Raises 10 reps    Toe Raise 10 reps    BAPS Sitting;Level 2;15 reps    BAPS Limitations cueing to reduce knee compensation                     PT Education - 09/19/21 1802     Education Details Educated beneftis with compression garments for edema control, measurements taken and handout given.    Person(s) Educated Patient    Methods Explanation;Handout    Comprehension Verbalized understanding              PT Short Term Goals - 09/12/21 1630       PT SHORT TERM GOAL #1   Title Patient will be independent with initial HEP and self-management strategies to improve functional outcomes    Time 4    Period Weeks    Status New    Target Date 10/10/21      PT SHORT TERM  GOAL #2   Title Patient will report at least 50% overall improvement in subjective complaint to indicate improvement in ability to perform ADLs.    Time 4    Period Weeks    Status New    Target Date 10/10/21      PT SHORT TERM GOAL #3   Title Patient will improve RT ankle DF AROM to at least 5 degrees for improved functional mobility and normailized gait mechanics.    Time 4    Period Weeks    Status New    Target Date 10/10/21               PT Long Term Goals - 09/12/21 1632       PT LONG TERM GOAL #1   Title Patient will improve RT ankle DF AROM to at least 10 degrees for improved functional mobility and normailized gait mechanics.    Time 12    Period Weeks    Status New    Target Date 12/05/21      PT LONG TERM GOAL #2   Title Patient will improve FOTO score to predicted value to indicate improvement in functional outcomes    Time 12    Period Weeks    Status New    Target Date 12/05/21      PT LONG TERM GOAL #3   Title Patient will report at least 75% overall improvement in subjective complaint to indicate improvement in ability to perform ADLs.    Time 12    Period Weeks    Status New    Target Date 12/05/21      PT LONG TERM GOAL #4   Title Patient will have equal to 5/5 MMT throughout RT ankle to improve ability to perform functional mobility, stair ambulation and ADLs.    Time 12    Period Weeks    Status New    Target Date 12/05/21                   Plan - 09/19/21 1815     Clinical Impression Statement Began session with gait trianing with crutches, able to demonstrate safe mechanics wiht LRAD.  Pt reports today marks 2 weeks and advised to ween out of boot at this point.  Educated benefits with weening out of boot and encouraged to increase by 1 hour gradually.  Began weight  bearing activities with good tolerance, just reports of tightness in dorsal aspect.  Pt limited by edema, educated on benefits with compression garments to assist with  edema control.  Measurements taken and handout given for [employer]  Continued ankle and toe mobility exercises as well as strengthneing.  No reports of pain through session.  Pt requested to bring tennis shoes wiht him next session.    Examination-Activity Limitations Locomotion Level;Stand;Lift;Transfers;Stairs    Examination-Participation Restrictions Community Activity;Occupation;Cleaning;Driving;Yard Work;Shop    Stability/Clinical Decision Making Stable/Uncomplicated    Clinical Decision Making Low    Rehab Potential Good    PT Frequency 2x / week    PT Duration 12 weeks    PT Treatment/Interventions ADLs/Self Care Home Management;Biofeedback;Traction;Moist Heat;Stair training;Functional mobility training;Neuromuscular re-education;Manual techniques;Passive range of motion;Dry needling;Energy conservation;Manual lymph drainage;Patient/family education;Therapeutic activities;Ultrasound;Cryotherapy;Parrafin;Fluidtherapy;Splinting;Spinal Manipulations;Visual/perceptual remediation/compensation;Vasopneumatic Device;Scar mobilization;Compression bandaging;Taping;Joint Manipulations;Orthotic Fit/Training;Balance training;Therapeutic exercise;Contrast Bath;DME Instruction;Electrical Stimulation;Iontophoresis 4mg /ml Dexamethasone;Gait training    PT Next Visit Plan Pt to bring tennis shoes next session.  Gait training wiht crutches/walker.  Continue ankle mobility exercises, increased BAPS L3 and add standing stretches.  Progress ankle ROM and LE strengthening as well with additional rockerboard and gait LRAD.    PT Home Exercise Plan Eval: Ankle band 3 way, seated calf stretch, SLR, bridge; 09/14/21: towel crunch, ankle circles, gastroc stretch, great toe movements, encouraged to elevate for edema control.; 10/11: heel/toe raises, towel Inv/Ev    Consulted and Agree with Plan of Care Patient             Patient will benefit from skilled therapeutic intervention in order to improve the  following deficits and impairments:  Pain, Improper body mechanics, Increased fascial restricitons, Abnormal gait, Decreased balance, Difficulty walking, Increased edema, Impaired flexibility, Hypomobility, Decreased strength, Decreased range of motion, Decreased activity tolerance, Decreased mobility, Decreased scar mobility  Visit Diagnosis: Pain in right lower leg  Other abnormalities of gait and mobility  Difficulty in walking, not elsewhere classified     Problem List Patient Active Problem List   Diagnosis Date Noted   Open pilon fracture, right, type III, initial encounter 06/14/2021   S/P small bowel resection 06/02/2021   06/04/2021, LPTA/CLT; CBIS [phone]  [phone], PTA 09/19/2021, 6:41 PM  [facility] [address] [place], [place], [place] Phone: [phone]   Fax:  [phone]  Name: Aaron Webster MRN: [place] Date of Birth: 02/04/1971

## 2021-09-21 ENCOUNTER — Other Ambulatory Visit: Payer: Self-pay

## 2021-09-21 ENCOUNTER — Encounter (HOSPITAL_COMMUNITY): Payer: Self-pay

## 2021-09-21 ENCOUNTER — Ambulatory Visit (HOSPITAL_COMMUNITY): Payer: Commercial Managed Care - PPO

## 2021-09-21 DIAGNOSIS — M79661 Pain in right lower leg: Secondary | ICD-10-CM | POA: Diagnosis not present

## 2021-09-21 DIAGNOSIS — R2689 Other abnormalities of gait and mobility: Secondary | ICD-10-CM

## 2021-09-21 DIAGNOSIS — R262 Difficulty in walking, not elsewhere classified: Secondary | ICD-10-CM

## 2021-09-21 NOTE — Therapy (Signed)
[facility] [address] [place], [place], [address] Phone: [phone]   Fax:  [phone]  Physical Therapy Treatment  Patient Details  Name: Aaron Webster MRN: [id number] Date of Birth: 1971-01-04 Referring Provider (PT): Truitt Merle MD   Encounter Date: 09/21/2021   PT End of Session - 09/21/21 1748     Visit Number 4    Number of Visits 24    Date for PT Re-Evaluation 12/05/21    Authorization Type UMR/ UHC    PT Start Time 1743    PT Stop Time 1826    PT Time Calculation (min) 43 min    Activity Tolerance Patient tolerated treatment well    Behavior During Therapy [facility] for tasks assessed/performed             History reviewed. No pertinent past medical history.  Past Surgical History:  Procedure Laterality Date   EXTERNAL FIXATION LEG Right 06/02/2021   Procedure: EXTERNAL FIXATION LEG;  Surgeon: Roby Lofts, MD;  Location: [facility] OR;  Service: Orthopedics;  Laterality: Right;   EXTERNAL FIXATION REMOVAL Right 06/29/2021   Procedure: REMOVAL EXTERNAL FIXATION LEG;  Surgeon: Roby Lofts, MD;  Location: [facility] OR;  Service: Orthopedics;  Laterality: Right;   I & D EXTREMITY Right 06/02/2021   Procedure: IRRIGATION AND DEBRIDEMENT EXTREMITY;  Surgeon: Roby Lofts, MD;  Location: [facility] OR;  Service: Orthopedics;  Laterality: Right;   LAPAROTOMY N/A 06/02/2021   Procedure: EXPLORATORY LAPAROTOMY;  Surgeon: Violeta Gelinas, MD;  Location: [facility] OR;  Service: General;  Laterality: N/A;   OPEN REDUCTION INTERNAL FIXATION (ORIF) TIBIA/FIBULA FRACTURE Right 06/29/2021   Procedure: OPEN REDUCTION INTERNAL FIXATION (ORIF) PILON FRACTURE;  Surgeon: Roby Lofts, MD;  Location: [facility] OR;  Service: Orthopedics;  Laterality: Right;    There were no vitals filed for this visit.   Subjective Assessment - 09/21/21 1745     Subjective Pt arrived with new crutches and brought pair of tennis shoes with him to session.  No reports of pain today.   Has been compliant with HEP and has been working on great toe movements.    Pertinent History RT tibia ORIF 06/29/21    Currently in Pain? No/denies                               [facility] Adult PT Treatment/Exercise - 09/21/21 0001       Ambulation/Gait   Ambulation/Gait Yes    Ambulation/Gait Assistance 5: Supervision    Ambulation Distance (Feet) --   Through session, cueing for equal stride length and heel to toe mechanics   Assistive device Crutches      Exercises   Exercises Ankle      Knee/Hip Exercises: Stretches   Gastroc Stretch 2 reps;30 seconds    Gastroc Stretch Limitations against wall, cueing to reduce hip rotation    Other Knee/Hip Stretches Great toe PROM extend and flex      Knee/Hip Exercises: Standing   Rocker Board 2 minutes    Rocker Board Limitations lateral    Other Standing Knee Exercises lateral weight shifting wiht Rt foot on foam, lateral, rotate and forward (heel to toe mechanics)      Manual Therapy   Manual Therapy Edema management;Passive ROM    Manual therapy comments Manual complete separate than rest of tx    Edema Management Retrograde massage for edema control with LE elevated  Passive ROM PROM all directions with 10" holds in pain free range      Ankle Exercises: Stretches   Gastroc Stretch 2 reps;30 seconds    Gastroc Stretch Limitations standing front of wall    Other Stretch Great toe flexion/extension PROM      Ankle Exercises: Seated   Towel Inversion/Eversion 3 reps    BAPS Sitting;Level 3;10 reps    BAPS Limitations limited DF wiht increased L3, improved mobiilty wiht reps                       PT Short Term Goals - 09/12/21 1630       PT SHORT TERM GOAL #1   Title Patient will be independent with initial HEP and self-management strategies to improve functional outcomes    Time 4    Period Weeks    Status New    Target Date 10/10/21      PT SHORT TERM GOAL #2   Title Patient will  report at least 50% overall improvement in subjective complaint to indicate improvement in ability to perform ADLs.    Time 4    Period Weeks    Status New    Target Date 10/10/21      PT SHORT TERM GOAL #3   Title Patient will improve RT ankle DF AROM to at least 5 degrees for improved functional mobility and normailized gait mechanics.    Time 4    Period Weeks    Status New    Target Date 10/10/21               PT Long Term Goals - 09/12/21 1632       PT LONG TERM GOAL #1   Title Patient will improve RT ankle DF AROM to at least 10 degrees for improved functional mobility and normailized gait mechanics.    Time 12    Period Weeks    Status New    Target Date 12/05/21      PT LONG TERM GOAL #2   Title Patient will improve FOTO score to predicted value to indicate improvement in functional outcomes    Time 12    Period Weeks    Status New    Target Date 12/05/21      PT LONG TERM GOAL #3   Title Patient will report at least 75% overall improvement in subjective complaint to indicate improvement in ability to perform ADLs.    Time 12    Period Weeks    Status New    Target Date 12/05/21      PT LONG TERM GOAL #4   Title Patient will have equal to 5/5 MMT throughout RT ankle to improve ability to perform functional mobility, stair ambulation and ADLs.    Time 12    Period Weeks    Status New    Target Date 12/05/21                   Plan - 09/21/21 1750     Clinical Impression Statement Pt brought tennis shoes with him this session, unable to fit foot into shoe due to edema.  Reviewed benefits with compression garments and encouraged pt to purchase.  Session focus on ankle moblity and gait training with crutches.  Good sequence noted.  Added rockerboard and standing weight bearing activities as pre-gait activities, complete barefoot as unable to fit foot into shoes.  Pt educated on specific tennis shoes with wider  toe would be beneficial with gait.  No  reports of pain through session.  Added standing gastroc stretch to HEP.    Examination-Activity Limitations Locomotion Level;Stand;Lift;Transfers;Stairs    Examination-Participation Restrictions Community Activity;Occupation;Cleaning;Driving;Yard Work;Shop    Stability/Clinical Decision Making Stable/Uncomplicated    Clinical Decision Making Low    Rehab Potential Good    PT Frequency 2x / week    PT Duration 12 weeks    PT Treatment/Interventions ADLs/Self Care Home Management;Biofeedback;Traction;Moist Heat;Stair training;Functional mobility training;Neuromuscular re-education;Manual techniques;Passive range of motion;Dry needling;Energy conservation;Manual lymph drainage;Patient/family education;Therapeutic activities;Ultrasound;Cryotherapy;Parrafin;Fluidtherapy;Splinting;Spinal Manipulations;Visual/perceptual remediation/compensation;Vasopneumatic Device;Scar mobilization;Compression bandaging;Taping;Joint Manipulations;Orthotic Fit/Training;Balance training;Therapeutic exercise;Contrast Bath;DME Instruction;Electrical Stimulation;Iontophoresis 4mg /ml Dexamethasone;Gait training    PT Next Visit Plan Gait training wiht crutches/walker.  Continue ankle mobility exercises, increased BAPS L3 and add standing stretches.  Progress ankle ROM and LE strengthening.    PT Home Exercise Plan Eval: Ankle band 3 way, seated calf stretch, SLR, bridge; 09/14/21: towel crunch, ankle circles, gastroc stretch, great toe movements, encouraged to elevate for edema control.; 10/11: heel/toe raises, towel Inv/Ev    Consulted and Agree with Plan of Care Patient             Patient will benefit from skilled therapeutic intervention in order to improve the following deficits and impairments:  Pain, Improper body mechanics, Increased fascial restricitons, Abnormal gait, Decreased balance, Difficulty walking, Increased edema, Impaired flexibility, Hypomobility, Decreased strength, Decreased range of motion, Decreased  activity tolerance, Decreased mobility, Decreased scar mobility  Visit Diagnosis: Pain in right lower leg  Other abnormalities of gait and mobility  Difficulty in walking, not elsewhere classified     Problem List Patient Active Problem List   Diagnosis Date Noted   Open pilon fracture, right, type III, initial encounter 06/14/2021   S/P small bowel resection 06/02/2021   06/04/2021, LPTA/CLT; CBIS [phone]  [phone], PTA 09/21/2021, 6:37 PM  [facility] [address] [place], [place], [place] Phone: [phone]   Fax:  [phone]  Name: Aaron Webster MRN: [place] Date of Birth: 10-13-1971

## 2021-09-26 ENCOUNTER — Other Ambulatory Visit: Payer: Self-pay

## 2021-09-26 ENCOUNTER — Encounter (HOSPITAL_COMMUNITY): Payer: Self-pay | Admitting: Physical Therapy

## 2021-09-26 ENCOUNTER — Ambulatory Visit (HOSPITAL_COMMUNITY): Payer: Commercial Managed Care - PPO | Admitting: Physical Therapy

## 2021-09-26 DIAGNOSIS — R262 Difficulty in walking, not elsewhere classified: Secondary | ICD-10-CM

## 2021-09-26 DIAGNOSIS — M79661 Pain in right lower leg: Secondary | ICD-10-CM

## 2021-09-26 DIAGNOSIS — R2689 Other abnormalities of gait and mobility: Secondary | ICD-10-CM

## 2021-09-26 NOTE — Therapy (Signed)
[facility] [address] [place], [place], [address] Phone: [phone]   Fax:  [phone]  Physical Therapy Treatment  Patient Details  Name: Aaron Webster MRN: [id number] Date of Birth: 09-08-1971 Referring Provider (PT): Truitt Merle MD   Encounter Date: 09/26/2021   PT End of Session - 09/26/21 1457     Visit Number 5    Number of Visits 24    Date for PT Re-Evaluation 12/05/21    Authorization Type UMR/ UHC    PT Start Time 1458    PT Stop Time 1536    PT Time Calculation (min) 38 min    Activity Tolerance Patient tolerated treatment well    Behavior During Therapy [facility] for tasks assessed/performed             History reviewed. No pertinent past medical history.  Past Surgical History:  Procedure Laterality Date   EXTERNAL FIXATION LEG Right 06/02/2021   Procedure: EXTERNAL FIXATION LEG;  Surgeon: Roby Lofts, MD;  Location: [facility] OR;  Service: Orthopedics;  Laterality: Right;   EXTERNAL FIXATION REMOVAL Right 06/29/2021   Procedure: REMOVAL EXTERNAL FIXATION LEG;  Surgeon: Roby Lofts, MD;  Location: [facility] OR;  Service: Orthopedics;  Laterality: Right;   I & D EXTREMITY Right 06/02/2021   Procedure: IRRIGATION AND DEBRIDEMENT EXTREMITY;  Surgeon: Roby Lofts, MD;  Location: [facility] OR;  Service: Orthopedics;  Laterality: Right;   LAPAROTOMY N/A 06/02/2021   Procedure: EXPLORATORY LAPAROTOMY;  Surgeon: Violeta Gelinas, MD;  Location: [facility] OR;  Service: General;  Laterality: N/A;   OPEN REDUCTION INTERNAL FIXATION (ORIF) TIBIA/FIBULA FRACTURE Right 06/29/2021   Procedure: OPEN REDUCTION INTERNAL FIXATION (ORIF) PILON FRACTURE;  Surgeon: Roby Lofts, MD;  Location: [facility] OR;  Service: Orthopedics;  Laterality: Right;    There were no vitals filed for this visit.   Subjective Assessment - 09/26/21 1459     Subjective No current pain. STates his pain comes and goes but no current pain. States toes has been stiff. States that  he is still in the boot but just when he goes oout and about. States he got diabetic socks but didnt' get any compression socks.    Pertinent History RT tibia ORIF 06/29/21                [facility] PT Assessment - 09/26/21 0001       Assessment   Medical Diagnosis RT tibia ORIF    Referring Provider (PT) Truitt Merle MD                           [facility] Adult PT Treatment/Exercise - 09/26/21 0001       Knee/Hip Exercises: Stretches   Gastroc Stretch Right;60 seconds;2 reps    Other Knee/Hip Stretches Great toe PROM extend and flex      Knee/Hip Exercises: Standing   Other Standing Knee Exercises lateral weight shifting wiht Rt foot on foam, x20 10" holds R and then foward x20 10" holds      Knee/Hip Exercises: Seated   Other Seated Knee/Hip Exercises PROM of right foot 3 minutes                       PT Short Term Goals - 09/12/21 1630       PT SHORT TERM GOAL #1   Title Patient will be independent with initial HEP and self-management strategies to improve functional outcomes  Time 4    Period Weeks    Status New    Target Date 10/10/21      PT SHORT TERM GOAL #2   Title Patient will report at least 50% overall improvement in subjective complaint to indicate improvement in ability to perform ADLs.    Time 4    Period Weeks    Status New    Target Date 10/10/21      PT SHORT TERM GOAL #3   Title Patient will improve RT ankle DF AROM to at least 5 degrees for improved functional mobility and normailized gait mechanics.    Time 4    Period Weeks    Status New    Target Date 10/10/21               PT Long Term Goals - 09/12/21 1632       PT LONG TERM GOAL #1   Title Patient will improve RT ankle DF AROM to at least 10 degrees for improved functional mobility and normailized gait mechanics.    Time 12    Period Weeks    Status New    Target Date 12/05/21      PT LONG TERM GOAL #2   Title Patient will improve FOTO score to  predicted value to indicate improvement in functional outcomes    Time 12    Period Weeks    Status New    Target Date 12/05/21      PT LONG TERM GOAL #3   Title Patient will report at least 75% overall improvement in subjective complaint to indicate improvement in ability to perform ADLs.    Time 12    Period Weeks    Status New    Target Date 12/05/21      PT LONG TERM GOAL #4   Title Patient will have equal to 5/5 MMT throughout RT ankle to improve ability to perform functional mobility, stair ambulation and ADLs.    Time 12    Period Weeks    Status New    Target Date 12/05/21                   Plan - 09/26/21 1528     Clinical Impression Statement Patient tolerated session well. Encouraged patient to bring in shoe next session and to get compression garments (wearing a diabetic sock on this date.) Tightness in foot noted with stretches. Educated patient in self stretches at home as well as weight shifts out of boot. Will continue with current POC.    Examination-Activity Limitations Locomotion Level;Stand;Lift;Transfers;Stairs    Examination-Participation Restrictions Community Activity;Occupation;Cleaning;Driving;Yard Work;Shop    Stability/Clinical Decision Making Stable/Uncomplicated    Rehab Potential Good    PT Frequency 2x / week    PT Duration 12 weeks    PT Treatment/Interventions ADLs/Self Care Home Management;Biofeedback;Traction;Moist Heat;Stair training;Functional mobility training;Neuromuscular re-education;Manual techniques;Passive range of motion;Dry needling;Energy conservation;Manual lymph drainage;Patient/family education;Therapeutic activities;Ultrasound;Cryotherapy;Parrafin;Fluidtherapy;Splinting;Spinal Manipulations;Visual/perceptual remediation/compensation;Vasopneumatic Device;Scar mobilization;Compression bandaging;Taping;Joint Manipulations;Orthotic Fit/Training;Balance training;Therapeutic exercise;Contrast Bath;DME Instruction;Electrical  Stimulation;Iontophoresis 4mg /ml Dexamethasone;Gait training    PT Next Visit Plan pt to see MD on 25th - update measuremnets, Gait training wiht crutches/walker.  Continue ankle mobility exercises, increased BAPS L3 and add standing stretches.  Progress ankle ROM and LE strengthening.    PT Home Exercise Plan Eval: Ankle band 3 way, seated calf stretch, SLR, bridge; 09/14/21: towel crunch, ankle circles, gastroc stretch, great toe movements, encouraged to elevate for edema control.; 10/11: heel/toe raises, towel Inv/Ev  Consulted and Agree with Plan of Care Patient             Patient will benefit from skilled therapeutic intervention in order to improve the following deficits and impairments:  Pain, Improper body mechanics, Increased fascial restricitons, Abnormal gait, Decreased balance, Difficulty walking, Increased edema, Impaired flexibility, Hypomobility, Decreased strength, Decreased range of motion, Decreased activity tolerance, Decreased mobility, Decreased scar mobility  Visit Diagnosis: Pain in right lower leg  Other abnormalities of gait and mobility  Difficulty in walking, not elsewhere classified     Problem List Patient Active Problem List   Diagnosis Date Noted   Open pilon fracture, right, type III, initial encounter 06/14/2021   S/P small bowel resection 06/02/2021   3:43 PM, 09/26/21 Tereasa Coop, DPT Physical Therapy with [facility]  [phone] office   [facility] [address] [place], [place], [address] Phone: [phone]   Fax:  [phone]  Name: Aaron Webster MRN: [id number] Date of Birth: 07-28-1971

## 2021-09-28 ENCOUNTER — Encounter (HOSPITAL_COMMUNITY): Payer: Self-pay

## 2021-09-28 ENCOUNTER — Other Ambulatory Visit: Payer: Self-pay

## 2021-09-28 ENCOUNTER — Ambulatory Visit (HOSPITAL_COMMUNITY): Payer: Commercial Managed Care - PPO

## 2021-09-28 DIAGNOSIS — R262 Difficulty in walking, not elsewhere classified: Secondary | ICD-10-CM

## 2021-09-28 DIAGNOSIS — M79661 Pain in right lower leg: Secondary | ICD-10-CM

## 2021-09-28 DIAGNOSIS — R2689 Other abnormalities of gait and mobility: Secondary | ICD-10-CM

## 2021-09-28 NOTE — Therapy (Signed)
[facility] [address] [place], [place], [address] Phone: [phone]   Fax:  [phone]  Physical Therapy Treatment  Patient Details  Name: Aaron Webster MRN: [id number] Date of Birth: February 14, 1971 Referring Provider (PT): Truitt Merle MD   Encounter Date: 09/28/2021   PT End of Session - 09/28/21 1759     Visit Number 6    Number of Visits 24    Date for PT Re-Evaluation 12/05/21    Authorization Type UMR/ UHC    PT Start Time 1747    PT Stop Time 1828    PT Time Calculation (min) 41 min    Activity Tolerance Patient tolerated treatment well    Behavior During Therapy [facility] for tasks assessed/performed             History reviewed. No pertinent past medical history.  Past Surgical History:  Procedure Laterality Date   EXTERNAL FIXATION LEG Right 06/02/2021   Procedure: EXTERNAL FIXATION LEG;  Surgeon: Roby Lofts, MD;  Location: [facility] OR;  Service: Orthopedics;  Laterality: Right;   EXTERNAL FIXATION REMOVAL Right 06/29/2021   Procedure: REMOVAL EXTERNAL FIXATION LEG;  Surgeon: Roby Lofts, MD;  Location: [facility] OR;  Service: Orthopedics;  Laterality: Right;   I & D EXTREMITY Right 06/02/2021   Procedure: IRRIGATION AND DEBRIDEMENT EXTREMITY;  Surgeon: Roby Lofts, MD;  Location: [facility] OR;  Service: Orthopedics;  Laterality: Right;   LAPAROTOMY N/A 06/02/2021   Procedure: EXPLORATORY LAPAROTOMY;  Surgeon: Violeta Gelinas, MD;  Location: [facility] OR;  Service: General;  Laterality: N/A;   OPEN REDUCTION INTERNAL FIXATION (ORIF) TIBIA/FIBULA FRACTURE Right 06/29/2021   Procedure: OPEN REDUCTION INTERNAL FIXATION (ORIF) PILON FRACTURE;  Surgeon: Roby Lofts, MD;  Location: [facility] OR;  Service: Orthopedics;  Laterality: Right;    There were no vitals filed for this visit.   Subjective Assessment - 09/28/21 1751     Subjective No reports of pain, arrived in boot and stated he has been walking barefoot at home, continues in boot  outdoors.  Reports 6 out of 8 hours without boot at home.  Stated he is wearing diabetic socks, didn't get any compression socks.  Does continues to have swelling in lateral aspect of foot/ankle.    Pertinent History RT tibia ORIF 06/29/21    Currently in Pain? No/denies                [facility] PT Assessment - 09/28/21 0001       Assessment   Medical Diagnosis RT tibia ORIF    Referring Provider (PT) Truitt Merle MD    Next MD Visit 10/03/21      Restrictions   Weight Bearing Restrictions Yes      AROM   AROM Assessment Site Ankle    Right/Left Knee Right    Right/Left Ankle Right    Right Ankle Dorsiflexion 1   was lacking 5 degrees from neutral   Right Ankle Plantar Flexion 35   was 35     Ambulation/Gait   Ambulation/Gait Yes    Ambulation/Gait Assistance 5: Supervision    Ambulation Distance (Feet) 200 Feet    Assistive device Crutches                           [facility] Adult PT Treatment/Exercise - 09/28/21 0001       Exercises   Exercises Ankle      Knee/Hip Exercises: Stretches   Knee:  Self-Stretch to increase Flexion 5 reps;10 seconds;Right    Knee: Self-Stretch Limitations knee drive for DF 78I 10"      Knee/Hip Exercises: Standing   Heel Raises 10 reps    Heel Raises Limitations add to HEP; Toe raises with cueing to prevent posterior lean    Rocker Board 2 minutes    Rocker Board Limitations lateral then DF/PF    Gait Training Barefoot heel to toe mechanics inside // bars 6RT heel toe                       PT Short Term Goals - 09/28/21 1805       PT SHORT TERM GOAL #1   Title Patient will be independent with initial HEP and self-management strategies to improve functional outcomes    Baseline 10/20:  Reports compliance with HEP daily and has been massaging tissue daily.    Status Achieved      PT SHORT TERM GOAL #2   Title Patient will report at least 50% overall improvement in subjective complaint to indicate  improvement in ability to perform ADLs.    Baseline 10/20:  Reports 50% improvements, main difficulty with toe movements.    Status Achieved      PT SHORT TERM GOAL #3   Title Patient will improve RT ankle DF AROM to at least 5 degrees for improved functional mobility and normailized gait mechanics.    Baseline 10/20:  see ROM improved DF to neutral was 5 degrees lacking from neutral    Status On-going               PT Long Term Goals - 09/12/21 1632       PT LONG TERM GOAL #1   Title Patient will improve RT ankle DF AROM to at least 10 degrees for improved functional mobility and normailized gait mechanics.    Time 12    Period Weeks    Status New    Target Date 12/05/21      PT LONG TERM GOAL #2   Title Patient will improve FOTO score to predicted value to indicate improvement in functional outcomes    Time 12    Period Weeks    Status New    Target Date 12/05/21      PT LONG TERM GOAL #3   Title Patient will report at least 75% overall improvement in subjective complaint to indicate improvement in ability to perform ADLs.    Time 12    Period Weeks    Status New    Target Date 12/05/21      PT LONG TERM GOAL #4   Title Patient will have equal to 5/5 MMT throughout RT ankle to improve ability to perform functional mobility, stair ambulation and ADLs.    Time 12    Period Weeks    Status New    Target Date 12/05/21                   Plan - 09/28/21 1803     Clinical Impression Statement Pt brought tennis shoe wiht, still unable to fit foot in but improved since last session.  Reviewed benefits and encouraged pt to get compression socks to assist with edema.  Gait training complete out of boot with min cueing to improve heel to toe mechanics, able to demonstrate good slow mechanics with equal stride length and stance phase.  Progressed strengthening to standing heel/toe raises and encouraged pt to  add this to HEP.  ROM measurements taken with improved DF to  neutral (was lacking 5 degrees initial eval).    Examination-Activity Limitations Locomotion Level;Stand;Lift;Transfers;Stairs    Examination-Participation Restrictions Community Activity;Occupation;Cleaning;Driving;Yard Work;Shop    Stability/Clinical Decision Making Stable/Uncomplicated    Clinical Decision Making Low    Rehab Potential Good    PT Frequency 2x / week    PT Duration 12 weeks    PT Treatment/Interventions ADLs/Self Care Home Management;Biofeedback;Traction;Moist Heat;Stair training;Functional mobility training;Neuromuscular re-education;Manual techniques;Passive range of motion;Dry needling;Energy conservation;Manual lymph drainage;Patient/family education;Therapeutic activities;Ultrasound;Cryotherapy;Parrafin;Fluidtherapy;Splinting;Spinal Manipulations;Visual/perceptual remediation/compensation;Vasopneumatic Device;Scar mobilization;Compression bandaging;Taping;Joint Manipulations;Orthotic Fit/Training;Balance training;Therapeutic exercise;Contrast Bath;DME Instruction;Electrical Stimulation;Iontophoresis 4mg /ml Dexamethasone;Gait training    PT Next Visit Plan F/U with MD apt on 25th.  Gait training wiht 1 crutch when able.  Progress ankle mobiltiy and LE strengthening.  Add STS next session with equal weight bearing BLE and begin slant board stretch.  Encourage pt to purchase compression garment for edema control.    PT Home Exercise Plan Eval: Ankle band 3 way, seated calf stretch, SLR, bridge; 09/14/21: towel crunch, ankle circles, gastroc stretch, great toe movements, encouraged to elevate for edema control.; 10/11: heel/toe raises, towel Inv/Ev; 10/20: standing heel/toe    Consulted and Agree with Plan of Care Patient             Patient will benefit from skilled therapeutic intervention in order to improve the following deficits and impairments:  Pain, Improper body mechanics, Increased fascial restricitons, Abnormal gait, Decreased balance, Difficulty walking, Increased  edema, Impaired flexibility, Hypomobility, Decreased strength, Decreased range of motion, Decreased activity tolerance, Decreased mobility, Decreased scar mobility  Visit Diagnosis: Pain in right lower leg  Other abnormalities of gait and mobility  Difficulty in walking, not elsewhere classified     Problem List Patient Active Problem List   Diagnosis Date Noted   Open pilon fracture, right, type III, initial encounter 06/14/2021   S/P small bowel resection 06/02/2021   06/04/2021, LPTA/CLT; CBIS [phone]  [phone], PTA 09/28/2021, 6:38 PM  [place] [facility] [address] [place], [place], [place] Phone: [phone]   Fax:  [phone]  Name: Aaron Webster MRN: [place] Date of Birth: Apr 22, 1971

## 2021-10-03 ENCOUNTER — Ambulatory Visit (HOSPITAL_COMMUNITY): Payer: Commercial Managed Care - PPO | Admitting: Physical Therapy

## 2021-10-04 ENCOUNTER — Ambulatory Visit (HOSPITAL_COMMUNITY): Payer: Commercial Managed Care - PPO

## 2021-10-04 ENCOUNTER — Other Ambulatory Visit: Payer: Self-pay

## 2021-10-04 DIAGNOSIS — R2689 Other abnormalities of gait and mobility: Secondary | ICD-10-CM

## 2021-10-04 DIAGNOSIS — M79661 Pain in right lower leg: Secondary | ICD-10-CM | POA: Diagnosis not present

## 2021-10-04 DIAGNOSIS — R262 Difficulty in walking, not elsewhere classified: Secondary | ICD-10-CM

## 2021-10-04 NOTE — Therapy (Signed)
[place], [place], [address] Phone: [phone]   Fax:  [phone]  Physical Therapy Treatment  Patient Details  Name: Aaron Webster MRN: [id number] Date of Birth: 1971-08-10 Referring Provider (PT): Katha Hamming MD   Encounter Date: 10/04/2021   PT End of Session - 10/04/21 1436     Visit Number 7    Number of Visits 24    Date for PT Re-Evaluation 12/05/21    Authorization Type UMR/ UHC    PT Start Time 1430    PT Stop Time 1515    PT Time Calculation (min) 45 min    Activity Tolerance Patient tolerated treatment well    Behavior During Therapy [facility] for tasks assessed/performed             No past medical history on file.  Past Surgical History:  Procedure Laterality Date   EXTERNAL FIXATION LEG Right 06/02/2021   Procedure: EXTERNAL FIXATION LEG;  Surgeon: Shona Needles, MD;  Location: [place];  Service: Orthopedics;  Laterality: Right;   EXTERNAL FIXATION REMOVAL Right 06/29/2021   Procedure: REMOVAL EXTERNAL FIXATION LEG;  Surgeon: Shona Needles, MD;  Location: [place];  Service: Orthopedics;  Laterality: Right;   I & D EXTREMITY Right 06/02/2021   Procedure: IRRIGATION AND DEBRIDEMENT EXTREMITY;  Surgeon: Shona Needles, MD;  Location: [place];  Service: Orthopedics;  Laterality: Right;   LAPAROTOMY N/A 06/02/2021   Procedure: EXPLORATORY LAPAROTOMY;  Surgeon: Georganna Skeans, MD;  Location: [place];  Service: General;  Laterality: N/A;   OPEN REDUCTION INTERNAL FIXATION (ORIF) TIBIA/FIBULA FRACTURE Right 06/29/2021   Procedure: OPEN REDUCTION INTERNAL FIXATION (ORIF) PILON FRACTURE;  Surgeon: Shona Needles, MD;  Location: [place];  Service: Orthopedics;  Laterality: Right;    There were no vitals filed for this visit.   Subjective Assessment - 10/04/21 1434     Subjective Pt had MD follow-up for right ankle.  Reports he has continued walking at home without fracture boot    Pertinent History RT  tibia ORIF 06/29/21                [facility] PT Assessment - 10/04/21 0001       Assessment   Medical Diagnosis RT tibia ORIF    Referring Provider (PT) Katha Hamming MD    Next MD Visit 11/14/21      Restrictions   Weight Bearing Restrictions Yes    LLE Weight Bearing Weight bearing as tolerated                           [facility] Adult PT Treatment/Exercise - 10/04/21 0001       Knee/Hip Exercises: Stretches   Gastroc Stretch Right;60 seconds;2 reps    Other Knee/Hip Stretches Great toe PROM extend and flex      Knee/Hip Exercises: Standing   Heel Raises 10 reps    Heel Raises Limitations rolled towel under met heads for added toe DF      Manual Therapy   Manual Therapy Joint mobilization    Manual therapy comments Manual complete separate than rest of tx    Joint Mobilization grade 2-3 joing mobilization to improve right ankle DF and toe DF      Ankle Exercises: Seated   Other Seated Ankle Exercises great toe plantarflexion with red band 3x10  PT Short Term Goals - 09/28/21 1805       PT SHORT TERM GOAL #1   Title Patient will be independent with initial HEP and self-management strategies to improve functional outcomes    Baseline 10/20:  Reports compliance with HEP daily and has been massaging tissue daily.    Status Achieved      PT SHORT TERM GOAL #2   Title Patient will report at least 50% overall improvement in subjective complaint to indicate improvement in ability to perform ADLs.    Baseline 10/20:  Reports 50% improvements, main difficulty with toe movements.    Status Achieved      PT SHORT TERM GOAL #3   Title Patient will improve RT ankle DF AROM to at least 5 degrees for improved functional mobility and normailized gait mechanics.    Baseline 10/20:  see ROM improved DF to neutral was 5 degrees lacking from neutral    Status On-going               PT Long Term Goals - 09/12/21 1632       PT  LONG TERM GOAL #1   Title Patient will improve RT ankle DF AROM to at least 10 degrees for improved functional mobility and normailized gait mechanics.    Time 12    Period Weeks    Status New    Target Date 12/05/21      PT LONG TERM GOAL #2   Title Patient will improve FOTO score to predicted value to indicate improvement in functional outcomes    Time 12    Period Weeks    Status New    Target Date 12/05/21      PT LONG TERM GOAL #3   Title Patient will report at least 75% overall improvement in subjective complaint to indicate improvement in ability to perform ADLs.    Time 12    Period Weeks    Status New    Target Date 12/05/21      PT LONG TERM GOAL #4   Title Patient will have equal to 5/5 MMT throughout RT ankle to improve ability to perform functional mobility, stair ambulation and ADLs.    Time 12    Period Weeks    Status New    Target Date 12/05/21                   Plan - 10/04/21 1533     Clinical Impression Statement Tolerating tx sessions well. Difficulty with RLE weight acceptance when not in boot.  Foot intrinsic weakness evident by compensation during ther ex. Continued sessions to imrpove right aknle ROM and strength to normalize gait    Examination-Activity Limitations Locomotion Level;Stand;Lift;Transfers;Stairs    Examination-Participation Restrictions Community Activity;Occupation;Cleaning;Driving;Yard Work;Shop    Stability/Clinical Decision Making Stable/Uncomplicated    Rehab Potential Good    PT Frequency 2x / week    PT Duration 12 weeks    PT Treatment/Interventions ADLs/Self Care Home Management;Biofeedback;Traction;Moist Heat;Stair training;Functional mobility training;Neuromuscular re-education;Manual techniques;Passive range of motion;Dry needling;Energy conservation;Manual lymph drainage;Patient/family education;Therapeutic activities;Ultrasound;Cryotherapy;Parrafin;Fluidtherapy;Splinting;Spinal Manipulations;Visual/perceptual  remediation/compensation;Vasopneumatic Device;Scar mobilization;Compression bandaging;Taping;Joint Manipulations;Orthotic Fit/Training;Balance training;Therapeutic exercise;Contrast Bath;DME Instruction;Electrical Stimulation;Iontophoresis 49m/ml Dexamethasone;Gait training    PT Next Visit Plan F/U with MD apt on 25th.  Gait training wiht 1 crutch when able.  Progress ankle mobiltiy and LE strengthening.  Add STS next session with equal weight bearing BLE and begin slant board stretch.  Encourage pt to purchase compression garment for edema control.  PT Home Exercise Plan Eval: Ankle band 3 way, seated calf stretch, SLR, bridge; 09/14/21: towel crunch, ankle circles, gastroc stretch, great toe movements, encouraged to elevate for edema control.; 10/11: heel/toe raises, towel Inv/Ev; 10/20: standing heel/toe    Consulted and Agree with Plan of Care Patient             Patient will benefit from skilled therapeutic intervention in order to improve the following deficits and impairments:  Pain, Improper body mechanics, Increased fascial restricitons, Abnormal gait, Decreased balance, Difficulty walking, Increased edema, Impaired flexibility, Hypomobility, Decreased strength, Decreased range of motion, Decreased activity tolerance, Decreased mobility, Decreased scar mobility  Visit Diagnosis: Pain in right lower leg  Other abnormalities of gait and mobility  Difficulty in walking, not elsewhere classified     Problem List Patient Active Problem List   Diagnosis Date Noted   Open pilon fracture, right, type III, initial encounter 06/14/2021   S/P small bowel resection 06/02/2021    Toniann Fail, PT 10/04/2021, 3:35 PM  [place], [place], [address] Phone: [phone]   Fax:  [phone]  Name: Courage Biglow MRN: [id number] Date of Birth: 1971-03-11

## 2021-10-05 ENCOUNTER — Encounter (HOSPITAL_COMMUNITY): Payer: Self-pay

## 2021-10-05 ENCOUNTER — Ambulatory Visit (HOSPITAL_COMMUNITY): Payer: Commercial Managed Care - PPO

## 2021-10-05 DIAGNOSIS — R2689 Other abnormalities of gait and mobility: Secondary | ICD-10-CM

## 2021-10-05 DIAGNOSIS — M79661 Pain in right lower leg: Secondary | ICD-10-CM | POA: Diagnosis not present

## 2021-10-05 DIAGNOSIS — R262 Difficulty in walking, not elsewhere classified: Secondary | ICD-10-CM

## 2021-10-05 NOTE — Therapy (Signed)
[place], [place], [address] Phone: [phone]   Fax:  [phone]  Physical Therapy Treatment  Patient Details  Name: Aaron Webster MRN: [id number] Date of Birth: 1971-02-06 Referring Provider (PT): Katha Hamming MD   Encounter Date: 10/05/2021   PT End of Session - 10/05/21 1741     Visit Number 8    Number of Visits 24    Date for PT Re-Evaluation 12/05/21    Authorization Type UMR/ UHC    PT Start Time 2197    PT Stop Time 5883    PT Time Calculation (min) 40 min    Activity Tolerance Patient tolerated treatment well;No increased pain    Behavior During Therapy [facility] for tasks assessed/performed             History reviewed. No pertinent past medical history.  Past Surgical History:  Procedure Laterality Date   EXTERNAL FIXATION LEG Right 06/02/2021   Procedure: EXTERNAL FIXATION LEG;  Surgeon: Shona Needles, MD;  Location: [place];  Service: Orthopedics;  Laterality: Right;   EXTERNAL FIXATION REMOVAL Right 06/29/2021   Procedure: REMOVAL EXTERNAL FIXATION LEG;  Surgeon: Shona Needles, MD;  Location: [place];  Service: Orthopedics;  Laterality: Right;   I & D EXTREMITY Right 06/02/2021   Procedure: IRRIGATION AND DEBRIDEMENT EXTREMITY;  Surgeon: Shona Needles, MD;  Location: [place];  Service: Orthopedics;  Laterality: Right;   LAPAROTOMY N/A 06/02/2021   Procedure: EXPLORATORY LAPAROTOMY;  Surgeon: Georganna Skeans, MD;  Location: [place];  Service: General;  Laterality: N/A;   OPEN REDUCTION INTERNAL FIXATION (ORIF) TIBIA/FIBULA FRACTURE Right 06/29/2021   Procedure: OPEN REDUCTION INTERNAL FIXATION (ORIF) PILON FRACTURE;  Surgeon: Shona Needles, MD;  Location: [place];  Service: Orthopedics;  Laterality: Right;    There were no vitals filed for this visit.   Subjective Assessment - 10/05/21 1735     Subjective Reports he has been walking out of boot at home.  No reports of pain currently.  COntinues  to arrive in dept with CAM boot on, reports for safety.  Has began new exercise for toe movements with towel at home.  Reports he has ordered compression socks off [facility], expected of arrive soon.    Pertinent History RT tibia ORIF 06/29/21    Currently in Pain? No/denies                               [facility] Adult PT Treatment/Exercise - 10/05/21 0001       Ambulation/Gait   Ambulation/Gait Yes    Ambulation/Gait Assistance 5: Supervision    Ambulation Distance (Feet) 200 Feet    Assistive device L Axillary Crutch    Gait Comments Cueing for sequence and heel to toe      Exercises   Exercises Ankle      Knee/Hip Exercises: Stretches   Knee: Self-Stretch to increase Flexion 5 reps;10 seconds    Knee: Self-Stretch Limitations knee drive for DF 25Q 10"    Gastroc Stretch 3 reps;30 seconds    Gastroc Stretch Limitations slant board    Other Knee/Hip Stretches Great toe PROM extend and flex      Knee/Hip Exercises: Standing   Heel Raises 10 reps    Heel Raises Limitations rolled towel under met heads for added toe DF    Gait Training Gait training with single crutch      Knee/Hip  Exercises: Seated   Other Seated Knee/Hip Exercises heel/toe raises    Sit to Sand 10 reps;without UE support   Elevated height to 22in height, cueing for equal weight bearing nose over toes     Manual Therapy   Manual Therapy Joint mobilization    Manual therapy comments Manual complete separate than rest of tx    Joint Mobilization grade 2-3 joint mobilization to improve right ankle DF and toe DF    Passive ROM PROM all directions with 10" holds in pain free range                       PT Short Term Goals - 09/28/21 1805       PT SHORT TERM GOAL #1   Title Patient will be independent with initial HEP and self-management strategies to improve functional outcomes    Baseline 10/20:  Reports compliance with HEP daily and has been massaging tissue daily.    Status  Achieved      PT SHORT TERM GOAL #2   Title Patient will report at least 50% overall improvement in subjective complaint to indicate improvement in ability to perform ADLs.    Baseline 10/20:  Reports 50% improvements, main difficulty with toe movements.    Status Achieved      PT SHORT TERM GOAL #3   Title Patient will improve RT ankle DF AROM to at least 5 degrees for improved functional mobility and normailized gait mechanics.    Baseline 10/20:  see ROM improved DF to neutral was 5 degrees lacking from neutral    Status On-going               PT Long Term Goals - 09/12/21 1632       PT LONG TERM GOAL #1   Title Patient will improve RT ankle DF AROM to at least 10 degrees for improved functional mobility and normailized gait mechanics.    Time 12    Period Weeks    Status New    Target Date 12/05/21      PT LONG TERM GOAL #2   Title Patient will improve FOTO score to predicted value to indicate improvement in functional outcomes    Time 12    Period Weeks    Status New    Target Date 12/05/21      PT LONG TERM GOAL #3   Title Patient will report at least 75% overall improvement in subjective complaint to indicate improvement in ability to perform ADLs.    Time 12    Period Weeks    Status New    Target Date 12/05/21      PT LONG TERM GOAL #4   Title Patient will have equal to 5/5 MMT throughout RT ankle to improve ability to perform functional mobility, stair ambulation and ADLs.    Time 12    Period Weeks    Status New    Target Date 12/05/21                   Plan - 10/05/21 1815     Clinical Impression Statement Began gait training with single crutch with min cueing for sequence.  Encouraged pt to increased weight bearing in normal shoes and get out of boot.  Therex focus with ankle and toe mobility and functional strenghtening to normalize gait.  Added STS with cueing for equal weight bearing, able to complete from 22in height.     Examination-Activity  Limitations Locomotion Level;Stand;Lift;Transfers;Stairs    Examination-Participation Restrictions Community Activity;Occupation;Cleaning;Driving;Yard Work;Shop    Clinical Decision Making Low    Rehab Potential Good    PT Frequency 2x / week    PT Duration 12 weeks    PT Treatment/Interventions ADLs/Self Care Home Management;Biofeedback;Traction;Moist Heat;Stair training;Functional mobility training;Neuromuscular re-education;Manual techniques;Passive range of motion;Dry needling;Energy conservation;Manual lymph drainage;Patient/family education;Therapeutic activities;Ultrasound;Cryotherapy;Parrafin;Fluidtherapy;Splinting;Spinal Manipulations;Visual/perceptual remediation/compensation;Vasopneumatic Device;Scar mobilization;Compression bandaging;Taping;Joint Manipulations;Orthotic Fit/Training;Balance training;Therapeutic exercise;Contrast Bath;DME Instruction;Electrical Stimulation;Iontophoresis 72m/ml Dexamethasone;Gait training    PT Next Visit Plan Progress ankle mobility, gait training wiht 1 crutch to no AD and LE strengthening.  Encourage use of compression garment for edema control.  Manual and active stretches for mobility.    PT Home Exercise Plan Eval: Ankle band 3 way, seated calf stretch, SLR, bridge; 09/14/21: towel crunch, ankle circles, gastroc stretch, great toe movements, encouraged to elevate for edema control.; 10/11: heel/toe raises, towel Inv/Ev; 10/20: standing heel/toe; 10/27: knee drive on step for dorsiflexion.    Consulted and Agree with Plan of Care Patient             Patient will benefit from skilled therapeutic intervention in order to improve the following deficits and impairments:  Pain, Improper body mechanics, Increased fascial restricitons, Abnormal gait, Decreased balance, Difficulty walking, Increased edema, Impaired flexibility, Hypomobility, Decreased strength, Decreased range of motion, Decreased activity tolerance, Decreased mobility,  Decreased scar mobility  Visit Diagnosis: Pain in right lower leg  Difficulty in walking, not elsewhere classified  Other abnormalities of gait and mobility     Problem List Patient Active Problem List   Diagnosis Date Noted   Open pilon fracture, right, type III, initial encounter 06/14/2021   S/P small bowel resection 06/02/2021   CIhor Austin LPTA/CLT; CBIS [phone] CAldona Lento PTA 10/05/2021, 6:26 PM  [place] [address]Phone: [phone]  Fax:  [phone] Name: LTylerjames HoglundMRN: [id number] of Birth: 627-Apr-1972

## 2021-10-10 ENCOUNTER — Encounter (HOSPITAL_COMMUNITY): Payer: Self-pay | Admitting: Physical Therapy

## 2021-10-10 ENCOUNTER — Ambulatory Visit (HOSPITAL_COMMUNITY): Payer: Commercial Managed Care - PPO | Attending: Student | Admitting: Physical Therapy

## 2021-10-10 ENCOUNTER — Other Ambulatory Visit: Payer: Self-pay

## 2021-10-10 DIAGNOSIS — M79661 Pain in right lower leg: Secondary | ICD-10-CM | POA: Insufficient documentation

## 2021-10-10 DIAGNOSIS — R262 Difficulty in walking, not elsewhere classified: Secondary | ICD-10-CM | POA: Insufficient documentation

## 2021-10-10 DIAGNOSIS — R2689 Other abnormalities of gait and mobility: Secondary | ICD-10-CM | POA: Insufficient documentation

## 2021-10-10 NOTE — Therapy (Signed)
[facility] [address] [place], [place], [address] Phone: [phone]   Fax:  [phone]  Physical Therapy Treatment  Patient Details  Name: Aaron Webster MRN: [id number] Date of Birth: 06-05-1971 Referring Provider (PT): Truitt Merle MD   Encounter Date: 10/10/2021   PT End of Session - 10/10/21 1650     Visit Number 9    Number of Visits 24    Date for PT Re-Evaluation 12/05/21    Authorization Type UMR/ UHC    PT Start Time 1645    PT Stop Time 1728    PT Time Calculation (min) 43 min    Activity Tolerance Patient tolerated treatment well;No increased pain    Behavior During Therapy [facility] for tasks assessed/performed             History reviewed. No pertinent past medical history.  Past Surgical History:  Procedure Laterality Date   EXTERNAL FIXATION LEG Right 06/02/2021   Procedure: EXTERNAL FIXATION LEG;  Surgeon: Roby Lofts, MD;  [facility];  Service: Orthopedics;  Laterality: Right;   EXTERNAL FIXATION REMOVAL Right 06/29/2021   Procedure: REMOVAL EXTERNAL FIXATION LEG;  Surgeon: Roby Lofts, MD;  [facility];  Service: Orthopedics;  Laterality: Right;   I & D EXTREMITY Right 06/02/2021   Procedure: IRRIGATION AND DEBRIDEMENT EXTREMITY;  Surgeon: Roby Lofts, MD;  [facility];  Service: Orthopedics;  Laterality: Right;   LAPAROTOMY N/A 06/02/2021   Procedure: EXPLORATORY LAPAROTOMY;  Surgeon: Violeta Gelinas, MD;  Location: [facility] OR;  Service: General;  Laterality: N/A;   OPEN REDUCTION INTERNAL FIXATION (ORIF) TIBIA/FIBULA FRACTURE Right 06/29/2021   Procedure: OPEN REDUCTION INTERNAL FIXATION (ORIF) PILON FRACTURE;  Surgeon: Roby Lofts, MD;  [facility];  Service: Orthopedics;  Laterality: Right;    There were no vitals filed for this visit.   Subjective Assessment - 10/10/21 1647     Subjective Patient states swelling is down and he can now we a regular shoe. He has compression socks  coming tomo. He is still walking with walker at home and 2 crutches outside.    Pertinent History RT tibia ORIF 06/29/21    Currently in Pain? No/denies                               [facility] Adult PT Treatment/Exercise - 10/10/21 0001       Knee/Hip Exercises: Stretches   Gastroc Stretch 3 reps;30 seconds    Gastroc Stretch Limitations slant board      Knee/Hip Exercises: Standing   Heel Raises 15 reps    Forward Step Up Both;2 sets;10 reps;Step Height: 4";Hand Hold: 2    Functional Squat 1 set;10 reps    Functional Squat Limitations using HHA x 2 on // bars    Gait Training Gait training with single crutch 2 RT in clinic    Other Standing Knee Exercises weight shift front/ back 10 x 3", lateral weight shift 10 x 3", tandem stance 2 x 30"    Other Standing Knee Exercises walking in // bars 5RT, sidestepping in // bars 5 RT                       PT Short Term Goals - 09/28/21 1805       PT SHORT TERM GOAL #1   Title Patient will be independent with initial HEP and self-management strategies  to improve functional outcomes    Baseline 10/20:  Reports compliance with HEP daily and has been massaging tissue daily.    Status Achieved      PT SHORT TERM GOAL #2   Title Patient will report at least 50% overall improvement in subjective complaint to indicate improvement in ability to perform ADLs.    Baseline 10/20:  Reports 50% improvements, main difficulty with toe movements.    Status Achieved      PT SHORT TERM GOAL #3   Title Patient will improve RT ankle DF AROM to at least 5 degrees for improved functional mobility and normailized gait mechanics.    Baseline 10/20:  see ROM improved DF to neutral was 5 degrees lacking from neutral    Status On-going               PT Long Term Goals - 09/12/21 1632       PT LONG TERM GOAL #1   Title Patient will improve RT ankle DF AROM to at least 10 degrees for improved functional mobility and  normailized gait mechanics.    Time 12    Period Weeks    Status New    Target Date 12/05/21      PT LONG TERM GOAL #2   Title Patient will improve FOTO score to predicted value to indicate improvement in functional outcomes    Time 12    Period Weeks    Status New    Target Date 12/05/21      PT LONG TERM GOAL #3   Title Patient will report at least 75% overall improvement in subjective complaint to indicate improvement in ability to perform ADLs.    Time 12    Period Weeks    Status New    Target Date 12/05/21      PT LONG TERM GOAL #4   Title Patient will have equal to 5/5 MMT throughout RT ankle to improve ability to perform functional mobility, stair ambulation and ADLs.    Time 12    Period Weeks    Status New    Target Date 12/05/21                   Plan - 10/10/21 1728     Clinical Impression Statement Patient able to progress functional activity with overall good tolerance. Added weight shifting and step ups for improved tolerance and pre-gait activity. Progressed to unsupported ambulation in bars, patient did well with this though did note some discomfort in foot and ankle. Patient able to ambulate in clinic using single axillary crutch. Educated patient on DC of double crutch and to begin ambulating without AD short distance in home to build confidence in WB ability. He has good static balance and stability evidenced by tandem stance. Patient will continue to benefit from ther ex progressions to decrease pain and improve functional mobility.    Examination-Activity Limitations Locomotion Level;Stand;Lift;Transfers;Stairs    Examination-Participation Restrictions Community Activity;Occupation;Cleaning;Driving;Yard Work;Shop    Rehab Potential Good    PT Frequency 2x / week    PT Duration 12 weeks    PT Treatment/Interventions ADLs/Self Care Home Management;Biofeedback;Traction;Moist Heat;Stair training;Functional mobility training;Neuromuscular  re-education;Manual techniques;Passive range of motion;Dry needling;Energy conservation;Manual lymph drainage;Patient/family education;Therapeutic activities;Ultrasound;Cryotherapy;Parrafin;Fluidtherapy;Splinting;Spinal Manipulations;Visual/perceptual remediation/compensation;Vasopneumatic Device;Scar mobilization;Compression bandaging;Taping;Joint Manipulations;Orthotic Fit/Training;Balance training;Therapeutic exercise;Contrast Bath;DME Instruction;Electrical Stimulation;Iontophoresis 4mg /ml Dexamethasone;Gait training    PT Next Visit Plan Progress ankle mobility, gait training wiht 1 crutch to no AD and LE strengthening. Encourage use of compression garment for  edema control. Manual and active stretches for mobility.    PT Home Exercise Plan Eval: Ankle band 3 way, seated calf stretch, SLR, bridge; 09/14/21: towel crunch, ankle circles, gastroc stretch, great toe movements, encouraged to elevate for edema control.; 10/11: heel/toe raises, towel Inv/Ev; 10/20: standing heel/toe; 10/27: knee drive on step for dorsiflexion. Access Code: [email or web address]    Consulted and Agree with Plan of Care Patient             Patient will benefit from skilled therapeutic intervention in order to improve the following deficits and impairments:  Pain, Improper body mechanics, Increased fascial restricitons, Abnormal gait, Decreased balance, Difficulty walking, Increased edema, Impaired flexibility, Hypomobility, Decreased strength, Decreased range of motion, Decreased activity tolerance, Decreased mobility, Decreased scar mobility  Visit Diagnosis: Pain in right lower leg  Difficulty in walking, not elsewhere classified  Other abnormalities of gait and mobility     Problem List Patient Active Problem List   Diagnosis Date Noted   Open pilon fracture, right, type III, initial encounter 06/14/2021   S/P small bowel resection 06/02/2021   5:54 PM, 10/10/21 Georges Lynch PT DPT  Physical Therapist with [facility]  [facility]  [phone]  [facility] [address] [place], [place], [address] Phone: [phone]   Fax:  [phone]  Name: Admiral Marcucci MRN: [id number] Date of Birth: 1971-06-01

## 2021-10-10 NOTE — Patient Instructions (Signed)
Access Code: [facility] URL: [email or web address] Date: 10/10/2021 Prepared by: Georges Lynch  Exercises Side Stepping with Counter Support - 2 x daily - 7 x weekly - 2 sets - 10 reps Staggered Stance Forward Backward Weight Shift with Unilateral Counter Support - 2 x daily - 7 x weekly - 2 sets - 10 reps Mini Squat with Counter Support - 2 x daily - 7 x weekly - 2 sets - 10 reps

## 2021-10-12 ENCOUNTER — Encounter (HOSPITAL_COMMUNITY): Payer: Self-pay | Admitting: Physical Therapy

## 2021-10-12 ENCOUNTER — Other Ambulatory Visit: Payer: Self-pay

## 2021-10-12 ENCOUNTER — Ambulatory Visit (HOSPITAL_COMMUNITY): Payer: Commercial Managed Care - PPO | Admitting: Physical Therapy

## 2021-10-12 DIAGNOSIS — R262 Difficulty in walking, not elsewhere classified: Secondary | ICD-10-CM

## 2021-10-12 DIAGNOSIS — M79661 Pain in right lower leg: Secondary | ICD-10-CM | POA: Diagnosis not present

## 2021-10-12 DIAGNOSIS — R2689 Other abnormalities of gait and mobility: Secondary | ICD-10-CM

## 2021-10-12 NOTE — Therapy (Signed)
[facility] [address] [place], [place], [address] Phone: [phone]   Fax:  [phone]  Physical Therapy Treatment  Patient Details  Name: Aaron Webster MRN: [id number] Date of Birth: Jun 15, 1971 Referring Provider (PT): Truitt Merle MD  Progress Note Reporting Period 09/12/21 to 10/12/21  See note below for Objective Data and Assessment of Progress/Goals.      Encounter Date: 10/12/2021   PT End of Session - 10/12/21 1703     Visit Number 10    Number of Visits 24    Date for PT Re-Evaluation 12/05/21    Authorization Type UMR/ [employer]    PT Start Time 1655    PT Stop Time 1734    PT Time Calculation (min) 39 min    Activity Tolerance Patient tolerated treatment well    Behavior During Therapy WFL for tasks assessed/performed             History reviewed. No pertinent past medical history.  Past Surgical History:  Procedure Laterality Date   EXTERNAL FIXATION LEG Right 06/02/2021   Procedure: EXTERNAL FIXATION LEG;  Surgeon: Roby Lofts, MD;  Location: [facility] OR;  Service: Orthopedics;  Laterality: Right;   EXTERNAL FIXATION REMOVAL Right 06/29/2021   Procedure: REMOVAL EXTERNAL FIXATION LEG;  Surgeon: Roby Lofts, MD;  Location: [facility] OR;  Service: Orthopedics;  Laterality: Right;   I & D EXTREMITY Right 06/02/2021   Procedure: IRRIGATION AND DEBRIDEMENT EXTREMITY;  Surgeon: Roby Lofts, MD;  Location: [facility] OR;  Service: Orthopedics;  Laterality: Right;   LAPAROTOMY N/A 06/02/2021   Procedure: EXPLORATORY LAPAROTOMY;  Surgeon: Violeta Gelinas, MD;  Location: [facility] OR;  Service: General;  Laterality: N/A;   OPEN REDUCTION INTERNAL FIXATION (ORIF) TIBIA/FIBULA FRACTURE Right 06/29/2021   Procedure: OPEN REDUCTION INTERNAL FIXATION (ORIF) PILON FRACTURE;  Surgeon: Roby Lofts, MD;  [facility]: [facility] OR;  Service: Orthopedics;  Laterality: Right;    There were no vitals filed for this visit.   Subjective Assessment - 10/12/21  1701     Subjective Ankle and leg were sore after last time. not too bad today. He has some compression socks that he has been using. No pain standing right now. More sore when he is laying down.    Pertinent History RT tibia ORIF 06/29/21    Currently in Pain? No/denies                [facility] PT Assessment - 10/12/21 0001       Assessment   Medical Diagnosis RT tibia ORIF    Referring Provider (PT) Truitt Merle MD    Onset Date/Surgical Date 06/29/21      Restrictions   Weight Bearing Restrictions No      Balance Screen   Has the patient fallen in the past 6 months No      Home Environment   Living Environment Private residence      Prior Function   Level of Independence Independent      Cognition   Overall Cognitive Status Within Functional Limits for tasks assessed      Observation/Other Assessments   Focus on Therapeutic Outcomes (FOTO)  65% function      AROM   Right Ankle Dorsiflexion 5    Right Ankle Plantar Flexion 40      Strength   Right Ankle Dorsiflexion 4+/5    Right Ankle Plantar Flexion 4/5    Right Ankle Inversion 4+/5   slight pain   Right  Ankle Eversion 4+/5                           [facility] Adult PT Treatment/Exercise - 10/12/21 0001       Ambulation/Gait   Ambulation/Gait Yes    Ambulation/Gait Assistance 6: Modified independent (Device/Increase time)    Assistive device L Axillary Crutch    Gait Pattern Decreased stride length;Decreased stance time - right    Ambulation Surface Level;Indoor      Knee/Hip Exercises: Doctor, hospital 3 reps;30 seconds    Gastroc Stretch Limitations slant board    Other Knee/Hip Stretches tandem stance on foam 2 x 30"      Knee/Hip Exercises: Standing   Heel Raises 20 reps    Forward Step Up Both;2 sets;10 reps;Hand Hold: 2;Step Height: 6"    Step Down Right;2 sets;10 reps;Hand Hold: 2;Step Height: 4"    Rocker Board 3 minutes    Rocker Board Limitations PF/DF    Gait  Training Gait training with single crutch 2 RT in clinic    Other Standing Knee Exercises tandem stance on foam 2 x 30"      Manual Therapy   Manual Therapy Joint mobilization;Passive ROM    Manual therapy comments Manual complete separate than rest of tx    Joint Mobilization grade II-III A/P ankle DF mobs    Passive ROM ankle DF PROM                       PT Short Term Goals - 10/12/21 1753       PT SHORT TERM GOAL #1   Title Patient will be independent with initial HEP and self-management strategies to improve functional outcomes    Baseline 10/20:  Reports compliance with HEP daily and has been massaging tissue daily.    Status Achieved      PT SHORT TERM GOAL #2   Title Patient will report at least 50% overall improvement in subjective complaint to indicate improvement in ability to perform ADLs.    Baseline Reports 65%    Status Achieved      PT SHORT TERM GOAL #3   Title Patient will improve RT ankle DF AROM to at least 5 degrees for improved functional mobility and normailized gait mechanics.    Status Achieved               PT Long Term Goals - 10/12/21 1754       PT LONG TERM GOAL #1   Title Patient will improve RT ankle DF AROM to at least 10 degrees for improved functional mobility and normailized gait mechanics.    Baseline Current 5 degrees    Time 12    Period Weeks    Status On-going      PT LONG TERM GOAL #2   Title Patient will improve FOTO score to predicted value to indicate improvement in functional outcomes    Baseline See FOTO    Time 12    Period Weeks    Status On-going      PT LONG TERM GOAL #3   Title Patient will report at least 75% overall improvement in subjective complaint to indicate improvement in ability to perform ADLs.    Baseline Reports 65%    Time 12    Period Weeks    Status On-going      PT LONG TERM GOAL #4   Title Patient will  have equal to 5/5 MMT throughout RT ankle to improve ability to perform  functional mobility, stair ambulation and ADLs.    Baseline see MMT    Time 12    Period Weeks    Status On-going                   Plan - 10/12/21 1751     Clinical Impression Statement Patient shows moderate progress to therapy goals. Improved WB tolerance and ankle MMTs. Also good static balance. Patient continues to be limited by AROM restriction and gait deficits. Continues to favor LT side with use of AD and UE support. Patient voicing some apprehension regarding healing process. Assured patient that at this point out from his surgery, bones are well healed and he should be WBAT. Encouraged patient to decrease UE assist with home exercise and weight shifts, as well as decreasing use of AD for return to normal gait. Patient will continue to benefit from skilled therapy services to address remaining deficits to decrease pain and improve functional mobility.    Examination-Activity Limitations Locomotion Level;Stand;Lift;Transfers;Stairs    Examination-Participation Restrictions Community Activity;Occupation;Cleaning;Driving;Yard Work;Shop    Rehab Potential Good    PT Frequency 2x / week    PT Duration 12 weeks    PT Treatment/Interventions ADLs/Self Care Home Management;Biofeedback;Traction;Moist Heat;Stair training;Functional mobility training;Neuromuscular re-education;Manual techniques;Passive range of motion;Dry needling;Energy conservation;Manual lymph drainage;Patient/family education;Therapeutic activities;Ultrasound;Cryotherapy;Parrafin;Fluidtherapy;Splinting;Spinal Manipulations;Visual/perceptual remediation/compensation;Vasopneumatic Device;Scar mobilization;Compression bandaging;Taping;Joint Manipulations;Orthotic Fit/Training;Balance training;Therapeutic exercise;Contrast Bath;DME Instruction;Electrical Stimulation;Iontophoresis 4mg /ml Dexamethasone;Gait training    PT Next Visit Plan Progress ankle mobility, gait training wiht 1 crutch to no AD and LE strengthening.  Encourage use of compression garment for edema control. Manual and active stretches for mobility.    PT Home Exercise Plan Eval: Ankle band 3 way, seated calf stretch, SLR, bridge; 09/14/21: towel crunch, ankle circles, gastroc stretch, great toe movements, encouraged to elevate for edema control.; 10/11: heel/toe raises, towel Inv/Ev; 10/20: standing heel/toe; 10/27: knee drive on step for dorsiflexion. Access Code: [email or web address]    Consulted and Agree with Plan of Care Patient             Patient will benefit from skilled therapeutic intervention in order to improve the following deficits and impairments:  Pain, Improper body mechanics, Increased fascial restricitons, Abnormal gait, Decreased balance, Difficulty walking, Increased edema, Impaired flexibility, Hypomobility, Decreased strength, Decreased range of motion, Decreased activity tolerance, Decreased mobility, Decreased scar mobility  Visit Diagnosis: Pain in right lower leg  Difficulty in walking, not elsewhere classified  Other abnormalities of gait and mobility     Problem List Patient Active Problem List   Diagnosis Date Noted   Open pilon fracture, right, type III, initial encounter 06/14/2021   S/P small bowel resection 06/02/2021   5:55 PM, 10/12/21 13/03/22 PT DPT  Physical Therapist with [place]  [facility]  [phone]  [facility] [address] [place], [place], [place] Phone: [phone]   Fax:  [phone]  Name: Taji Barretto MRN: [place] Date of Birth: June 12, 1971

## 2021-10-17 ENCOUNTER — Ambulatory Visit (HOSPITAL_COMMUNITY): Payer: Commercial Managed Care - PPO | Admitting: Physical Therapy

## 2021-10-17 ENCOUNTER — Encounter (HOSPITAL_COMMUNITY): Payer: Self-pay | Admitting: Physical Therapy

## 2021-10-17 ENCOUNTER — Other Ambulatory Visit: Payer: Self-pay

## 2021-10-17 DIAGNOSIS — R2689 Other abnormalities of gait and mobility: Secondary | ICD-10-CM

## 2021-10-17 DIAGNOSIS — M79661 Pain in right lower leg: Secondary | ICD-10-CM | POA: Diagnosis not present

## 2021-10-17 DIAGNOSIS — R262 Difficulty in walking, not elsewhere classified: Secondary | ICD-10-CM

## 2021-10-17 NOTE — Patient Instructions (Signed)
Access Code: [email or web address] URL: [email or web address] Date: 10/17/2021 Prepared by: Georges Lynch  Exercises Hip Abduction with Resistance Loop - 2 x daily - 7 x weekly - 2 sets - 10 reps - 2-3 sec hold Hip Extension with Resistance Loop - 2 x daily - 7 x weekly - 2 sets - 10 reps - 2-3 sec hold Standing Terminal Knee Extension with Resistance - 2 x daily - 7 x weekly - 2 sets - 10 reps - 2-3 sec hold Standing Toe Raises at Chair - 2 x daily - 7 x weekly - 2 sets - 10 reps - 2-3 sec hold Eccentric Squat - 2 x daily - 7 x weekly - 2 sets - 10 reps - 3 second hold

## 2021-10-18 NOTE — Progress Notes (Signed)
   10/17/21 0001  Knee/Hip Exercises: Stretches  Gastroc Stretch 3 reps;30 seconds  Gastroc Stretch Limitations slant board  Other Knee/Hip Stretches ankle DF MWM on 18 inch box 15 x 5"  Knee/Hip Exercises: Aerobic  Recumbent Bike 4 min dynamic warmup  Knee/Hip Exercises: Standing  Heel Raises Limitations toe raises x20  Forward Step Up Right;2 sets;10 reps;Hand Hold: 1;Step Height: 6"  Step Down Right;2 sets;10 reps;Hand Hold: 2;Step Height: 4"  Other Standing Knee Exercises hip abduction and extension GTB x15 each, TKE GTB 15 x 5"  SLS 3 x 20" finger touch assist

## 2021-10-18 NOTE — Therapy (Signed)
[facility] [address] [place], [place], [address] Phone: [phone]   Fax:  [phone]  Physical Therapy Treatment  Patient Details  Name: Aaron Webster MRN: [id number] Date of Birth: [birth year] Referring Provider (PT): Truitt Merle MD   Encounter Date: 10/17/2021   PT End of Session - 10/17/21 1739     Visit Number 11    Number of Visits 24    Date for PT Re-Evaluation 12/05/21    Authorization Type UMR/ [facility]    PT Start Time 1735    PT Stop Time 1813    PT Time Calculation (min) 38 min    Activity Tolerance Patient tolerated treatment well    Behavior During Therapy [facility] for tasks assessed/performed             History reviewed. No pertinent past medical history.  Past Surgical History:  Procedure Laterality Date   EXTERNAL FIXATION LEG Right 06/02/2021   Procedure: EXTERNAL FIXATION LEG;  Surgeon: Roby Lofts, MD;  Location: [facility] OR;  Service: Orthopedics;  Laterality: Right;   EXTERNAL FIXATION REMOVAL Right 06/29/2021   Procedure: REMOVAL EXTERNAL FIXATION LEG;  Surgeon: Roby Lofts, MD;  Location: [facility] OR;  Service: Orthopedics;  Laterality: Right;   I & D EXTREMITY Right 06/02/2021   Procedure: IRRIGATION AND DEBRIDEMENT EXTREMITY;  Surgeon: Roby Lofts, MD;  Location: [facility] OR;  Service: Orthopedics;  Laterality: Right;   LAPAROTOMY N/A 06/02/2021   Procedure: EXPLORATORY LAPAROTOMY;  Surgeon: Violeta Gelinas, MD;  Location: [facility] OR;  Service: General;  Laterality: N/A;   OPEN REDUCTION INTERNAL FIXATION (ORIF) TIBIA/FIBULA FRACTURE Right 06/29/2021   Procedure: OPEN REDUCTION INTERNAL FIXATION (ORIF) PILON FRACTURE;  Surgeon: Roby Lofts, MD;  Location: [facility];  Service: Orthopedics;  Laterality: Right;    There were no vitals filed for this visit.   Subjective Assessment - 10/17/21 1738     Subjective Patient noted some increased swelling around ankle. He has been doing more walking and standing lately.     Pertinent History RT tibia ORIF 06/29/21    Currently in Pain? No/denies                    10/17/21 0001  Knee/Hip Exercises: Stretches  Gastroc Stretch 3 reps;30 seconds  Gastroc Stretch Limitations slant board  Other Knee/Hip Stretches ankle DF MWM on 18 inch box 15 x 5"  Knee/Hip Exercises: Aerobic  Recumbent Bike 4 min dynamic warmup  Knee/Hip Exercises: Standing  Heel Raises Limitations toe raises x20  Forward Step Up Right;2 sets;10 reps;Hand Hold: 1;Step Height: 6"  Step Down Right;2 sets;10 reps;Hand Hold: 2;Step Height: 4"  Other Standing Knee Exercises hip abduction and extension GTB x15 each, TKE GTB 15 x 5"  SLS 3 x 20" finger touch assist          PT Short Term Goals - 10/12/21 1753       PT SHORT TERM GOAL #1   Title Patient will be independent with initial HEP and self-management strategies to improve functional outcomes    Baseline 10/20:  Reports compliance with HEP daily and has been massaging tissue daily.    Status Achieved      PT SHORT TERM GOAL #2   Title Patient will report at least 50% overall improvement in subjective complaint to indicate improvement in ability to perform ADLs.    Baseline Reports 65%    Status Achieved      PT SHORT TERM  GOAL #3   Title Patient will improve RT ankle DF AROM to at least 5 degrees for improved functional mobility and normailized gait mechanics.    Status Achieved               PT Long Term Goals - 10/12/21 1754       PT LONG TERM GOAL #1   Title Patient will improve RT ankle DF AROM to at least 10 degrees for improved functional mobility and normailized gait mechanics.    Baseline Current 5 degrees    Time 12    Period Weeks    Status On-going      PT LONG TERM GOAL #2   Title Patient will improve FOTO score to predicted value to indicate improvement in functional outcomes    Baseline See FOTO    Time 12    Period Weeks    Status On-going      PT LONG TERM GOAL #3   Title Patient  will report at least 75% overall improvement in subjective complaint to indicate improvement in ability to perform ADLs.    Baseline Reports 65%    Time 12    Period Weeks    Status On-going      PT LONG TERM GOAL #4   Title Patient will have equal to 5/5 MMT throughout RT ankle to improve ability to perform functional mobility, stair ambulation and ADLs.    Baseline see MMT    Time 12    Period Weeks    Status On-going                   Plan - 10/18/21 1834     Clinical Impression Statement Introduced MWM for increased ankle DF. Continued with established POC for increased WB tolerance and improved gait mechanics. Patient tolerated all activity well this date with no increased complaints of pain. Noting improved ankle mobility end of session. Will continue to progress functional strengthening and WB as tolerated for improved functional mobility.    Examination-Activity Limitations Locomotion Level;Stand;Lift;Transfers;Stairs    Examination-Participation Restrictions Community Activity;Occupation;Cleaning;Driving;Yard Work;Shop    Rehab Potential Good    PT Frequency 2x / week    PT Duration 12 weeks    PT Treatment/Interventions ADLs/Self Care Home Management;Biofeedback;Traction;Moist Heat;Stair training;Functional mobility training;Neuromuscular re-education;Manual techniques;Passive range of motion;Dry needling;Energy conservation;Manual lymph drainage;Patient/family education;Therapeutic activities;Ultrasound;Cryotherapy;Parrafin;Fluidtherapy;Splinting;Spinal Manipulations;Visual/perceptual remediation/compensation;Vasopneumatic Device;Scar mobilization;Compression bandaging;Taping;Joint Manipulations;Orthotic Fit/Training;Balance training;Therapeutic exercise;Contrast Bath;DME Instruction;Electrical Stimulation;Iontophoresis 4mg /ml Dexamethasone;Gait training    PT Next Visit Plan Progress ankle mobility, gait training wiht 1 crutch to no AD and LE strengthening. Encourage use  of compression garment for edema control. Manual and active stretches for mobility.    PT Home Exercise Plan Eval: Ankle band 3 way, seated calf stretch, SLR, bridge; 09/14/21: towel crunch, ankle circles, gastroc stretch, great toe movements, encouraged to elevate for edema control.; 10/11: heel/toe raises, towel Inv/Ev; 10/20: standing heel/toe; 10/27: knee drive on step for dorsiflexion. Access Code: [email or web address]    Consulted and Agree with Plan of Care Patient             Patient will benefit from skilled therapeutic intervention in order to improve the following deficits and impairments:  Pain, Improper body mechanics, Increased fascial restricitons, Abnormal gait, Decreased balance, Difficulty walking, Increased edema, Impaired flexibility, Hypomobility, Decreased strength, Decreased range of motion, Decreased activity tolerance, Decreased mobility, Decreased scar mobility  Visit Diagnosis: Pain in right lower leg  Difficulty in walking, not elsewhere classified  Other abnormalities of gait and  mobility     Problem List Patient Active Problem List   Diagnosis Date Noted   Open pilon fracture, right, type III, initial encounter 06/14/2021   S/P small bowel resection 06/02/2021   6:43 PM, 10/18/21 Aaron Webster PT DPT  Physical Therapist with [place]  [facility]  [phone]  [facility] [address] [place], [place], [address] Phone: [phone]   Fax:  [phone]  Name: Aaron Webster MRN: [id number] Date of Birth: March 23, 1971

## 2021-10-19 ENCOUNTER — Ambulatory Visit (HOSPITAL_COMMUNITY): Payer: Commercial Managed Care - PPO | Admitting: Physical Therapy

## 2021-10-24 ENCOUNTER — Other Ambulatory Visit: Payer: Self-pay

## 2021-10-24 ENCOUNTER — Ambulatory Visit (HOSPITAL_COMMUNITY): Payer: Commercial Managed Care - PPO | Admitting: Physical Therapy

## 2021-10-24 DIAGNOSIS — M79661 Pain in right lower leg: Secondary | ICD-10-CM

## 2021-10-24 DIAGNOSIS — R2689 Other abnormalities of gait and mobility: Secondary | ICD-10-CM

## 2021-10-24 DIAGNOSIS — R262 Difficulty in walking, not elsewhere classified: Secondary | ICD-10-CM

## 2021-10-24 NOTE — Therapy (Signed)
[facility] [address] [place], [place], [address] Phone: [phone]   Fax:  [phone]  Physical Therapy Treatment  Patient Details  Name: Aaron Webster MRN: [id number] Date of Birth: 1971/06/20 Referring Provider (PT): Truitt Merle MD   Encounter Date: 10/24/2021   PT End of Session - 10/24/21 1732     Visit Number 12    Number of Visits 24    Date for PT Re-Evaluation 12/05/21    Authorization Type UMR/ UHC    PT Start Time 1733    PT Stop Time 1818    PT Time Calculation (min) 45 min    Activity Tolerance Patient tolerated treatment well    Behavior During Therapy [facility] for tasks assessed/performed             No past medical history on file.  Past Surgical History:  Procedure Laterality Date   EXTERNAL FIXATION LEG Right 06/02/2021   Procedure: EXTERNAL FIXATION LEG;  Surgeon: Roby Lofts, MD;  [facility]: [facility] OR;  Service: Orthopedics;  Laterality: Right;   EXTERNAL FIXATION REMOVAL Right 06/29/2021   Procedure: REMOVAL EXTERNAL FIXATION LEG;  Surgeon: Roby Lofts, MD;  [facility]: [facility] OR;  Service: Orthopedics;  Laterality: Right;   I & D EXTREMITY Right 06/02/2021   Procedure: IRRIGATION AND DEBRIDEMENT EXTREMITY;  Surgeon: Roby Lofts, MD;  Location: [facility] OR;  Service: Orthopedics;  Laterality: Right;   LAPAROTOMY N/A 06/02/2021   Procedure: EXPLORATORY LAPAROTOMY;  Surgeon: Violeta Gelinas, MD;  Location: [facility] OR;  Service: General;  Laterality: N/A;   OPEN REDUCTION INTERNAL FIXATION (ORIF) TIBIA/FIBULA FRACTURE Right 06/29/2021   Procedure: OPEN REDUCTION INTERNAL FIXATION (ORIF) PILON FRACTURE;  Surgeon: Roby Lofts, MD;  [facility] OR;  Service: Orthopedics;  Laterality: Right;    There were no vitals filed for this visit.   Subjective Assessment - 10/24/21 1735     Subjective Patient says he is still using a crutch because he does not trust his balance outside. He also says when he uses his foot too  much it swells and he has some pain and he does not want "to go backwards".    Pertinent History RT tibia ORIF 06/29/21    Currently in Pain? No/denies                               [facility] Adult PT Treatment/Exercise - 10/24/21 0001       Knee/Hip Exercises: Stretches   Gastroc Stretch 3 reps;30 seconds    Gastroc Stretch Limitations slant board    Other Knee/Hip Stretches ankle DF MWM on 12 inch box 15 x 5"      Knee/Hip Exercises: Aerobic   Recumbent Bike 4 min dynamic warmup      Knee/Hip Exercises: Machines for Strengthening   Cybex Leg Press 4 plates 3 x 10      Knee/Hip Exercises: Standing   Heel Raises Limitations toe raises x20    Forward Step Up Right;2 sets;10 reps;Hand Hold: 1;Step Height: 6"    Step Down Right;2 sets;10 reps;Step Height: 6";Hand Hold: 1    Rocker Board 4 minutes   DF/PF   SLS x30 sec on foam with HHA x 1, 3 x 10" on solid floor no HHA (7 sec max)                       PT Short Term Goals -  10/12/21 1753       PT SHORT TERM GOAL #1   Title Patient will be independent with initial HEP and self-management strategies to improve functional outcomes    Baseline 10/20:  Reports compliance with HEP daily and has been massaging tissue daily.    Status Achieved      PT SHORT TERM GOAL #2   Title Patient will report at least 50% overall improvement in subjective complaint to indicate improvement in ability to perform ADLs.    Baseline Reports 65%    Status Achieved      PT SHORT TERM GOAL #3   Title Patient will improve RT ankle DF AROM to at least 5 degrees for improved functional mobility and normailized gait mechanics.    Status Achieved               PT Long Term Goals - 10/12/21 1754       PT LONG TERM GOAL #1   Title Patient will improve RT ankle DF AROM to at least 10 degrees for improved functional mobility and normailized gait mechanics.    Baseline Current 5 degrees    Time 12    Period Weeks     Status On-going      PT LONG TERM GOAL #2   Title Patient will improve FOTO score to predicted value to indicate improvement in functional outcomes    Baseline See FOTO    Time 12    Period Weeks    Status On-going      PT LONG TERM GOAL #3   Title Patient will report at least 75% overall improvement in subjective complaint to indicate improvement in ability to perform ADLs.    Baseline Reports 65%    Time 12    Period Weeks    Status On-going      PT LONG TERM GOAL #4   Title Patient will have equal to 5/5 MMT throughout RT ankle to improve ability to perform functional mobility, stair ambulation and ADLs.    Baseline see MMT    Time 12    Period Weeks    Status On-going                   Plan - 10/24/21 1815     Clinical Impression Statement Patient tolerated session well overall. Able to progress strengthening with added leg press and increase step down height to 6 inch box. Continues to be limited by reduced static balance and stabilization in single limb stance. Patient will continue to benefit form skilled therapy services to progress LE strength and stabilization for improved functional mobility and return to PLOF.    Examination-Activity Limitations Locomotion Level;Stand;Lift;Transfers;Stairs    Examination-Participation Restrictions Community Activity;Occupation;Cleaning;Driving;Yard Work;Shop    Rehab Potential Good    PT Frequency 2x / week    PT Duration 12 weeks    PT Treatment/Interventions ADLs/Self Care Home Management;Biofeedback;Traction;Moist Heat;Stair training;Functional mobility training;Neuromuscular re-education;Manual techniques;Passive range of motion;Dry needling;Energy conservation;Manual lymph drainage;Patient/family education;Therapeutic activities;Ultrasound;Cryotherapy;Parrafin;Fluidtherapy;Splinting;Spinal Manipulations;Visual/perceptual remediation/compensation;Vasopneumatic Device;Scar mobilization;Compression bandaging;Taping;Joint  Manipulations;Orthotic Fit/Training;Balance training;Therapeutic exercise;Contrast Bath;DME Instruction;Electrical Stimulation;Iontophoresis 4mg /ml Dexamethasone;Gait training    PT Next Visit Plan Progress ankle mobility, gait training wiht 1 crutch to no AD and LE strengthening. Encourage use of compression garment for edema control. Manual and active stretches for mobility.    PT Home Exercise Plan Eval: Ankle band 3 way, seated calf stretch, SLR, bridge; 09/14/21: towel crunch, ankle circles, gastroc stretch, great toe movements, encouraged to elevate for edema control.; 10/11:  heel/toe raises, towel Inv/Ev; 10/20: standing heel/toe; 10/27: knee drive on step for dorsiflexion. Access Code: [email or web address]    Consulted and Agree with Plan of Care Patient             Patient will benefit from skilled therapeutic intervention in order to improve the following deficits and impairments:  Pain, Improper body mechanics, Increased fascial restricitons, Abnormal gait, Decreased balance, Difficulty walking, Increased edema, Impaired flexibility, Hypomobility, Decreased strength, Decreased range of motion, Decreased activity tolerance, Decreased mobility, Decreased scar mobility  Visit Diagnosis: Pain in right lower leg  Difficulty in walking, not elsewhere classified  Other abnormalities of gait and mobility     Problem List Patient Active Problem List   Diagnosis Date Noted   Open pilon fracture, right, type III, initial encounter 06/14/2021   S/P small bowel resection 06/02/2021   6:21 PM, 10/24/21 Georges Lynch PT DPT  Physical Therapist with [place]  [facility]  [phone]   [facility] [address] [place], [place], [address] Phone: [phone]   Fax:  [phone]  Name: Georgie Haque MRN: [id number] Date of Birth: 07-Aug-1971

## 2021-10-26 ENCOUNTER — Other Ambulatory Visit: Payer: Self-pay

## 2021-10-26 ENCOUNTER — Encounter (HOSPITAL_COMMUNITY): Payer: Self-pay | Admitting: Physical Therapy

## 2021-10-26 ENCOUNTER — Ambulatory Visit (HOSPITAL_COMMUNITY): Payer: Commercial Managed Care - PPO | Admitting: Physical Therapy

## 2021-10-26 DIAGNOSIS — M79661 Pain in right lower leg: Secondary | ICD-10-CM

## 2021-10-26 DIAGNOSIS — R262 Difficulty in walking, not elsewhere classified: Secondary | ICD-10-CM

## 2021-10-26 DIAGNOSIS — R2689 Other abnormalities of gait and mobility: Secondary | ICD-10-CM

## 2021-10-26 NOTE — Therapy (Signed)
[facility] [address] [place], [place], [address] Phone: [phone]   Fax:  [phone]  Physical Therapy Treatment  Patient Details  Name: Aaron Webster MRN: [id number] Date of Birth: 1971-04-19 Referring Provider (PT): Truitt Merle MD   Encounter Date: 10/26/2021   PT End of Session - 10/26/21 1714     Visit Number 13    Number of Visits 24    Date for PT Re-Evaluation 12/05/21    Authorization Type UMR/ [facility]    PT Start Time 1715    PT Stop Time 1810    PT Time Calculation (min) 55 min    Activity Tolerance Patient tolerated treatment well    Behavior During Therapy [facility] for tasks assessed/performed             History reviewed. No pertinent past medical history.  Past Surgical History:  Procedure Laterality Date   EXTERNAL FIXATION LEG Right 06/02/2021   Procedure: EXTERNAL FIXATION LEG;  Surgeon: Roby Lofts, MD;  [facility];  Service: Orthopedics;  Laterality: Right;   EXTERNAL FIXATION REMOVAL Right 06/29/2021   Procedure: REMOVAL EXTERNAL FIXATION LEG;  Surgeon: Roby Lofts, MD;  [facility];  Service: Orthopedics;  Laterality: Right;   I & D EXTREMITY Right 06/02/2021   Procedure: IRRIGATION AND DEBRIDEMENT EXTREMITY;  Surgeon: Roby Lofts, MD;  [facility];  Service: Orthopedics;  Laterality: Right;   LAPAROTOMY N/A 06/02/2021   Procedure: EXPLORATORY LAPAROTOMY;  Surgeon: Violeta Gelinas, MD;  Location: [facility] OR;  Service: General;  Laterality: N/A;   OPEN REDUCTION INTERNAL FIXATION (ORIF) TIBIA/FIBULA FRACTURE Right 06/29/2021   Procedure: OPEN REDUCTION INTERNAL FIXATION (ORIF) PILON FRACTURE;  Surgeon: Roby Lofts, MD;  [facility];  Service: Orthopedics;  Laterality: Right;    There were no vitals filed for this visit.   Subjective Assessment - 10/26/21 1718     Subjective Doing well, still noting some swelling.    Pertinent History RT tibia ORIF 06/29/21    Currently in Pain?  No/denies                               [facility] Adult PT Treatment/Exercise - 10/26/21 0001       Knee/Hip Exercises: Stretches   Gastroc Stretch 3 reps;30 seconds    Gastroc Stretch Limitations slant board    Other Knee/Hip Stretches ankle DF MWM on 12 inch box 15 x 5"      Knee/Hip Exercises: Aerobic   Recumbent Bike 4 min dynamic warmup LV 2      Knee/Hip Exercises: Machines for Strengthening   Cybex Leg Press 4 plates 2 x 10    Other Machine machine walk outs 6pl x15      Knee/Hip Exercises: Standing   Heel Raises 20 reps    Heel Raises Limitations from incline slope    Forward Step Up Right;2 sets;10 reps;Hand Hold: 1;Step Height: 6"    Step Down Right;2 sets;10 reps;Step Height: 6";Hand Hold: 1    Gait Training 200 feet with no AD, cues for heel toe transition                       PT Short Term Goals - 10/12/21 1753       PT SHORT TERM GOAL #1   Title Patient will be independent with initial HEP and self-management strategies to improve functional outcomes  Baseline 10/20:  Reports compliance with HEP daily and has been massaging tissue daily.    Status Achieved      PT SHORT TERM GOAL #2   Title Patient will report at least 50% overall improvement in subjective complaint to indicate improvement in ability to perform ADLs.    Baseline Reports 65%    Status Achieved      PT SHORT TERM GOAL #3   Title Patient will improve RT ankle DF AROM to at least 5 degrees for improved functional mobility and normailized gait mechanics.    Status Achieved               PT Long Term Goals - 10/12/21 1754       PT LONG TERM GOAL #1   Title Patient will improve RT ankle DF AROM to at least 10 degrees for improved functional mobility and normailized gait mechanics.    Baseline Current 5 degrees    Time 12    Period Weeks    Status On-going      PT LONG TERM GOAL #2   Title Patient will improve FOTO score to predicted value to  indicate improvement in functional outcomes    Baseline See FOTO    Time 12    Period Weeks    Status On-going      PT LONG TERM GOAL #3   Title Patient will report at least 75% overall improvement in subjective complaint to indicate improvement in ability to perform ADLs.    Baseline Reports 65%    Time 12    Period Weeks    Status On-going      PT LONG TERM GOAL #4   Title Patient will have equal to 5/5 MMT throughout RT ankle to improve ability to perform functional mobility, stair ambulation and ADLs.    Baseline see MMT    Time 12    Period Weeks    Status On-going                   Plan - 10/26/21 1819     Clinical Impression Statement Patient tolerated session well. Continued with established POC for LE and ankle strengthening. Patient showing improved functional mobility with step ups and downs. Introduced weighted walkouts for LE strengthening. Patient continues to be limited by altered gait mechanics and decreased stance on RLE, but is able to improve with verbal cues and practice. Patient will continue to benefit from skilled therapy to reduce deficits and improve functional ability.    Examination-Activity Limitations Locomotion Level;Stand;Lift;Transfers;Stairs    Examination-Participation Restrictions Community Activity;Occupation;Cleaning;Driving;Yard Work;Shop    Rehab Potential Good    PT Frequency 2x / week    PT Duration 12 weeks    PT Treatment/Interventions ADLs/Self Care Home Management;Biofeedback;Traction;Moist Heat;Stair training;Functional mobility training;Neuromuscular re-education;Manual techniques;Passive range of motion;Dry needling;Energy conservation;Manual lymph drainage;Patient/family education;Therapeutic activities;Ultrasound;Cryotherapy;Parrafin;Fluidtherapy;Splinting;Spinal Manipulations;Visual/perceptual remediation/compensation;Vasopneumatic Device;Scar mobilization;Compression bandaging;Taping;Joint Manipulations;Orthotic  Fit/Training;Balance training;Therapeutic exercise;Contrast Bath;DME Instruction;Electrical Stimulation;Iontophoresis 4mg /ml Dexamethasone;Gait training    PT Next Visit Plan Progress ankle mobility, gait training wiht 1 crutch to no AD and LE strengthening. Encourage use of compression garment for edema control. Manual and active stretches for mobility.    PT Home Exercise Plan Eval: Ankle band 3 way, seated calf stretch, SLR, bridge; 09/14/21: towel crunch, ankle circles, gastroc stretch, great toe movements, encouraged to elevate for edema control.; 10/11: heel/toe raises, towel Inv/Ev; 10/20: standing heel/toe; 10/27: knee drive on step for dorsiflexion. Access Code: [facility]    Consulted and Agree with  Plan of Care Patient             Patient will benefit from skilled therapeutic intervention in order to improve the following deficits and impairments:  Pain, Improper body mechanics, Increased fascial restricitons, Abnormal gait, Decreased balance, Difficulty walking, Increased edema, Impaired flexibility, Hypomobility, Decreased strength, Decreased range of motion, Decreased activity tolerance, Decreased mobility, Decreased scar mobility  Visit Diagnosis: Pain in right lower leg  Difficulty in walking, not elsewhere classified  Other abnormalities of gait and mobility     Problem List Patient Active Problem List   Diagnosis Date Noted   Open pilon fracture, right, type III, initial encounter 06/14/2021   S/P small bowel resection 06/02/2021   6:23 PM, 10/26/21 Georges Lynch PT DPT  Physical Therapist with [place]  [facility]  [phone]  [facility] [address] [place], [place], [address] Phone: [phone]   Fax:  [phone]  Name: Aaron Webster MRN: [id number] Date of Birth: 08/03/1971

## 2021-10-30 ENCOUNTER — Ambulatory Visit (HOSPITAL_COMMUNITY): Payer: Commercial Managed Care - PPO | Admitting: Physical Therapy

## 2021-10-31 ENCOUNTER — Other Ambulatory Visit: Payer: Self-pay

## 2021-10-31 ENCOUNTER — Encounter (HOSPITAL_COMMUNITY): Payer: Self-pay

## 2021-10-31 ENCOUNTER — Ambulatory Visit (HOSPITAL_COMMUNITY): Payer: Commercial Managed Care - PPO

## 2021-10-31 DIAGNOSIS — R262 Difficulty in walking, not elsewhere classified: Secondary | ICD-10-CM

## 2021-10-31 DIAGNOSIS — R2689 Other abnormalities of gait and mobility: Secondary | ICD-10-CM

## 2021-10-31 DIAGNOSIS — M79661 Pain in right lower leg: Secondary | ICD-10-CM

## 2021-10-31 NOTE — Therapy (Signed)
[facility] [address] [place], [place], [address] Phone: [phone]   Fax:  [phone]  Physical Therapy Treatment  Patient Details  Name: Aaron Webster MRN: [id number] Date of Birth: 21-Jul-1971 Referring Provider (PT): Truitt Merle MD   Encounter Date: 10/31/2021   PT End of Session - 10/31/21 1650     Visit Number 14    Number of Visits 24    Date for PT Re-Evaluation 12/05/21    Authorization Type UMR/ UHC    PT Start Time 1640    PT Stop Time 1723    PT Time Calculation (min) 43 min    Activity Tolerance Patient tolerated treatment well    Behavior During Therapy [facility] for tasks assessed/performed             History reviewed. No pertinent past medical history.  Past Surgical History:  Procedure Laterality Date   EXTERNAL FIXATION LEG Right 06/02/2021   Procedure: EXTERNAL FIXATION LEG;  Surgeon: Roby Lofts, MD;  [facility];  Service: Orthopedics;  Laterality: Right;   EXTERNAL FIXATION REMOVAL Right 06/29/2021   Procedure: REMOVAL EXTERNAL FIXATION LEG;  Surgeon: Roby Lofts, MD;  [facility] OR;  Service: Orthopedics;  Laterality: Right;   I & D EXTREMITY Right 06/02/2021   Procedure: IRRIGATION AND DEBRIDEMENT EXTREMITY;  Surgeon: Roby Lofts, MD;  [facility];  Service: Orthopedics;  Laterality: Right;   LAPAROTOMY N/A 06/02/2021   Procedure: EXPLORATORY LAPAROTOMY;  Surgeon: Violeta Gelinas, MD;  Location: [facility] OR;  Service: General;  Laterality: N/A;   OPEN REDUCTION INTERNAL FIXATION (ORIF) TIBIA/FIBULA FRACTURE Right 06/29/2021   Procedure: OPEN REDUCTION INTERNAL FIXATION (ORIF) PILON FRACTURE;  Surgeon: Roby Lofts, MD;  [facility];  Service: Orthopedics;  Laterality: Right;    There were no vitals filed for this visit.   Subjective Assessment - 10/31/21 1646     Subjective Pt stated he has some pain medial shin today, pain scale 5/10.  Continues to have swelling in medial ankle.     Pertinent History RT tibia ORIF 06/29/21    Currently in Pain? Yes    Pain Score 5     Pain Location Leg    Pain Orientation Right    Pain Descriptors / Indicators Sore;Aching    Pain Type Acute pain    Aggravating Factors  weight bearing    Pain Relieving Factors non-weight bearing                               [facility] Adult PT Treatment/Exercise - 10/31/21 0001       Ambulation/Gait   Stairs Yes    Stairs Assistance 6: Modified independent (Device/Increase time)    Number of Stairs 4   10RT 2HR then 1 HR   Height of Stairs 7      Knee/Hip Exercises: Stretches   Other Knee/Hip Stretches ankle DF MWM on 12 inch box 15 x 5"      Knee/Hip Exercises: Aerobic   Tread Mill Gait trainer x4 min 1.16mph, cueing for equal stride length, DF and Lt knee extension wiht      Knee/Hip Exercises: Machines for Strengthening   Cybex Leg Press 4 plates with PF 2 x 10    Other Machine machine walk outs 6pl x 2 min      Knee/Hip Exercises: Standing   Heel Raises 20 reps    Heel Raises  Limitations from incline slope; toe raise    Stairs 10RT 7in, 5 with 2HR then 5 with 1 HR    Gait Training Forward/backward down long hallway wiht mirror feedback    Other Standing Knee Exercises tandem gait on foam 3RT                       PT Short Term Goals - 10/12/21 1753       PT SHORT TERM GOAL #1   Title Patient will be independent with initial HEP and self-management strategies to improve functional outcomes    Baseline 10/20:  Reports compliance with HEP daily and has been massaging tissue daily.    Status Achieved      PT SHORT TERM GOAL #2   Title Patient will report at least 50% overall improvement in subjective complaint to indicate improvement in ability to perform ADLs.    Baseline Reports 65%    Status Achieved      PT SHORT TERM GOAL #3   Title Patient will improve RT ankle DF AROM to at least 5 degrees for improved functional mobility and normailized  gait mechanics.    Status Achieved               PT Long Term Goals - 10/12/21 1754       PT LONG TERM GOAL #1   Title Patient will improve RT ankle DF AROM to at least 10 degrees for improved functional mobility and normailized gait mechanics.    Baseline Current 5 degrees    Time 12    Period Weeks    Status On-going      PT LONG TERM GOAL #2   Title Patient will improve FOTO score to predicted value to indicate improvement in functional outcomes    Baseline See FOTO    Time 12    Period Weeks    Status On-going      PT LONG TERM GOAL #3   Title Patient will report at least 75% overall improvement in subjective complaint to indicate improvement in ability to perform ADLs.    Baseline Reports 65%    Time 12    Period Weeks    Status On-going      PT LONG TERM GOAL #4   Title Patient will have equal to 5/5 MMT throughout RT ankle to improve ability to perform functional mobility, stair ambulation and ADLs.    Baseline see MMT    Time 12    Period Weeks    Status On-going                   Plan - 10/31/21 1725     Clinical Impression Statement Session focus iwht improved gait mechanics to address antalgic mechanics.  Began treadmill gait trainer with visual and verbal cueing to improve Rt stance phase and increased Lt LE stride length and improve knee extension.  Pt c/o shin pain with Rt toe push off during gait.  Continued mobility and functional strengthening with cueing for control and to reduce UE support with steps and heel raises.  No reports of increased pain through session.    Examination-Activity Limitations Locomotion Level;Stand;Lift;Transfers;Stairs    Examination-Participation Restrictions Community Activity;Occupation;Cleaning;Driving;Yard Work;Shop    Clinical Decision Making Low    Rehab Potential Good    PT Frequency 2x / week    PT Duration 12 weeks    PT Treatment/Interventions ADLs/Self Care Home Management;Biofeedback;Traction;Moist  Heat;Stair training;Functional mobility training;Neuromuscular re-education;Manual  techniques;Passive range of motion;Dry needling;Energy conservation;Manual lymph drainage;Patient/family education;Therapeutic activities;Ultrasound;Cryotherapy;Parrafin;Fluidtherapy;Splinting;Spinal Manipulations;Visual/perceptual remediation/compensation;Vasopneumatic Device;Scar mobilization;Compression bandaging;Taping;Joint Manipulations;Orthotic Fit/Training;Balance training;Therapeutic exercise;Contrast Bath;DME Instruction;Electrical Stimulation;Iontophoresis 4mg /ml Dexamethasone;Gait training    PT Next Visit Plan Progress ankle mobility, gait training wiht 1 crutch to no AD and LE strengthening. Encourage use of compression garment for edema control. Manual and active stretches for mobility.    PT Home Exercise Plan Eval: Ankle band 3 way, seated calf stretch, SLR, bridge; 09/14/21: towel crunch, ankle circles, gastroc stretch, great toe movements, encouraged to elevate for edema control.; 10/11: heel/toe raises, towel Inv/Ev; 10/20: standing heel/toe; 10/27: knee drive on step for dorsiflexion. Access Code: [email or web address]    Consulted and Agree with Plan of Care Patient             Patient will benefit from skilled therapeutic intervention in order to improve the following deficits and impairments:  Pain, Improper body mechanics, Increased fascial restricitons, Abnormal gait, Decreased balance, Difficulty walking, Increased edema, Impaired flexibility, Hypomobility, Decreased strength, Decreased range of motion, Decreased activity tolerance, Decreased mobility, Decreased scar mobility  Visit Diagnosis: Pain in right lower leg  Difficulty in walking, not elsewhere classified  Other abnormalities of gait and mobility     Problem List Patient Active Problem List   Diagnosis Date Noted   Open pilon fracture, right, type III, initial encounter 06/14/2021   S/P small bowel resection 06/02/2021   06/04/2021, LPTA/CLT; CBIS [phone]  [phone], PTA 10/31/2021, 5:31 PM  [place] [address] [place], [place], [place] Phone: [phone]   Fax:  [phone]  Name: Cristhian Vanhook MRN: [place] Date of Birth: [birth year]

## 2021-11-07 ENCOUNTER — Ambulatory Visit (HOSPITAL_COMMUNITY): Payer: Commercial Managed Care - PPO | Admitting: Physical Therapy

## 2021-11-07 ENCOUNTER — Other Ambulatory Visit: Payer: Self-pay

## 2021-11-07 ENCOUNTER — Encounter (HOSPITAL_COMMUNITY): Payer: Self-pay | Admitting: Physical Therapy

## 2021-11-07 DIAGNOSIS — M79661 Pain in right lower leg: Secondary | ICD-10-CM

## 2021-11-07 DIAGNOSIS — R2689 Other abnormalities of gait and mobility: Secondary | ICD-10-CM

## 2021-11-07 DIAGNOSIS — R262 Difficulty in walking, not elsewhere classified: Secondary | ICD-10-CM

## 2021-11-08 NOTE — Progress Notes (Signed)
   11/07/21 0001  Knee/Hip Exercises: Stretches  Gastroc Stretch 3 reps;30 seconds  Gastroc Stretch Limitations slant board  Knee/Hip Exercises: Aerobic  Nustep 5 minutes Lv 3  Knee/Hip Exercises: Machines for Strengthening  Other Machine machine walk outs FWD/ RETRO 6pl x 10 each  Knee/Hip Exercises: Standing  Heel Raises Limitations heel/toe raises x10  Forward Step Up Right;1 set;20 reps;Hand Hold: 1 (power ups on 7 inch step)  Functional Squat 1 set;15 reps  Functional Squat Limitations to chair  Stairs 5RT, 7 inch, reciprocal, 1 hand rail  SLS 3 x20" int HHA  Gait Training 1 RT in clinic cues for heel/toe transition  Other Standing Knee Exercises step ups on BOSU HHA x1, 2 x 10  Manual Therapy  Manual Therapy Soft tissue mobilization  Manual therapy comments Manual complete separate than rest of tx  Soft tissue mobilization Theragun to medial aspect of RT tibia

## 2021-11-08 NOTE — Therapy (Signed)
[facility] [address] [place], [place], [address] Phone: [phone]   Fax:  [phone]  Physical Therapy Treatment  Patient Details  Name: Aaron Webster MRN: [id number] Date of Birth: 22-Jun-1971 Referring Provider (PT): Truitt Merle MD   Encounter Date: 11/07/2021   PT End of Session - 11/07/21 1717     Visit Number 15    Number of Visits 24    Date for PT Re-Evaluation 12/05/21    Authorization Type UMR/ [facility]    PT Start Time 1712    PT Stop Time 1805    PT Time Calculation (min) 53 min    Activity Tolerance Patient tolerated treatment well    Behavior During Therapy [facility] for tasks assessed/performed             History reviewed. No pertinent past medical history.  Past Surgical History:  Procedure Laterality Date   EXTERNAL FIXATION LEG Right 06/02/2021   Procedure: EXTERNAL FIXATION LEG;  Surgeon: Roby Lofts, MD;  Location: [facility] OR;  Service: Orthopedics;  Laterality: Right;   EXTERNAL FIXATION REMOVAL Right 06/29/2021   Procedure: REMOVAL EXTERNAL FIXATION LEG;  Surgeon: Roby Lofts, MD;  Location: [facility] OR;  Service: Orthopedics;  Laterality: Right;   I & D EXTREMITY Right 06/02/2021   Procedure: IRRIGATION AND DEBRIDEMENT EXTREMITY;  Surgeon: Roby Lofts, MD;  Location: [facility] OR;  Service: Orthopedics;  Laterality: Right;   LAPAROTOMY N/A 06/02/2021   Procedure: EXPLORATORY LAPAROTOMY;  Surgeon: Violeta Gelinas, MD;  Location: [facility] OR;  Service: General;  Laterality: N/A;   OPEN REDUCTION INTERNAL FIXATION (ORIF) TIBIA/FIBULA FRACTURE Right 06/29/2021   Procedure: OPEN REDUCTION INTERNAL FIXATION (ORIF) PILON FRACTURE;  Surgeon: Roby Lofts, MD;  Location: [facility] OR;  Service: Orthopedics;  Laterality: Right;    There were no vitals filed for this visit.   Subjective Assessment - 11/07/21 1716     Subjective Still having some swelling. Pain in shin area but intermittent. No pain currently. Still walking with  crutch.    Pertinent History RT tibia ORIF 06/29/21    Limitations Standing;Walking;House hold activities    Currently in Pain? No/denies                 11/07/21 0001  Knee/Hip Exercises: Stretches  Gastroc Stretch 3 reps;30 seconds  Gastroc Stretch Limitations slant board  Knee/Hip Exercises: Aerobic  Nustep 5 minutes Lv 3  Knee/Hip Exercises: Machines for Strengthening  Other Machine machine walk outs FWD/ RETRO 6pl x 10 each  Knee/Hip Exercises: Standing  Heel Raises Limitations heel/toe raises x10  Forward Step Up Right;1 set;20 reps;Hand Hold: 1 (power ups on 7 inch step)  Functional Squat 1 set;15 reps  Functional Squat Limitations to chair  Stairs 5RT, 7 inch, reciprocal, 1 hand rail  SLS 3 x20" int HHA  Gait Training 1 RT in clinic cues for heel/toe transition  Other Standing Knee Exercises step ups on BOSU HHA x1, 2 x 10  Manual Therapy  Manual Therapy Soft tissue mobilization  Manual therapy comments Manual complete separate than rest of tx  Soft tissue mobilization Theragun to medial aspect of RT tibia       PT Short Term Goals - 10/12/21 1753       PT SHORT TERM GOAL #1   Title Patient will be independent with initial HEP and self-management strategies to improve functional outcomes    Baseline 10/20:  Reports compliance with HEP daily and has been massaging tissue  daily.    Status Achieved      PT SHORT TERM GOAL #2   Title Patient will report at least 50% overall improvement in subjective complaint to indicate improvement in ability to perform ADLs.    Baseline Reports 65%    Status Achieved      PT SHORT TERM GOAL #3   Title Patient will improve RT ankle DF AROM to at least 5 degrees for improved functional mobility and normailized gait mechanics.    Status Achieved               PT Long Term Goals - 10/12/21 1754       PT LONG TERM GOAL #1   Title Patient will improve RT ankle DF AROM to at least 10 degrees for improved functional  mobility and normailized gait mechanics.    Baseline Current 5 degrees    Time 12    Period Weeks    Status On-going      PT LONG TERM GOAL #2   Title Patient will improve FOTO score to predicted value to indicate improvement in functional outcomes    Baseline See FOTO    Time 12    Period Weeks    Status On-going      PT LONG TERM GOAL #3   Title Patient will report at least 75% overall improvement in subjective complaint to indicate improvement in ability to perform ADLs.    Baseline Reports 65%    Time 12    Period Weeks    Status On-going      PT LONG TERM GOAL #4   Title Patient will have equal to 5/5 MMT throughout RT ankle to improve ability to perform functional mobility, stair ambulation and ADLs.    Baseline see MMT    Time 12    Period Weeks    Status On-going                   Plan - 11/08/21 1146     Clinical Impression Statement Continued with focus on normalized gait and improved WB through RLE. Patient cued on even weight distribution during chair squats. Showing improved single limb stability. Continues to demo stiff leg gait despite cueing. Patient notes he feels there are some underlying issues with his RT knee which are causing deviations. Trialed manual STM to area of reported restriction at medial aspect of tibia, but with no noted improvement. Knee ROM appears WFL. Will continue to progress functional strength and stabilization activity to further improve functional mobility level.    Examination-Activity Limitations Locomotion Level;Stand;Lift;Transfers;Stairs    Examination-Participation Restrictions Community Activity;Occupation;Cleaning;Driving;Yard Work;Shop    Rehab Potential Good    PT Frequency 2x / week    PT Duration 12 weeks    PT Treatment/Interventions ADLs/Self Care Home Management;Biofeedback;Traction;Moist Heat;Stair training;Functional mobility training;Neuromuscular re-education;Manual techniques;Passive range of motion;Dry  needling;Energy conservation;Manual lymph drainage;Patient/family education;Therapeutic activities;Ultrasound;Cryotherapy;Parrafin;Fluidtherapy;Splinting;Spinal Manipulations;Visual/perceptual remediation/compensation;Vasopneumatic Device;Scar mobilization;Compression bandaging;Taping;Joint Manipulations;Orthotic Fit/Training;Balance training;Therapeutic exercise;Contrast Bath;DME Instruction;Electrical Stimulation;Iontophoresis 4mg /ml Dexamethasone;Gait training    PT Next Visit Plan Gait training withno AD and LE strengthening. Encourage use of compression garment for edema control. Manual and active stretches for mobility. Balance and ambulation on compliant surfaces, eccentric quad strengthening    PT Home Exercise Plan Eval: Ankle band 3 way, seated calf stretch, SLR, bridge; 09/14/21: towel crunch, ankle circles, gastroc stretch, great toe movements, encouraged to elevate for edema control.; 10/11: heel/toe raises, towel Inv/Ev; 10/20: standing heel/toe; 10/27: knee drive on step for dorsiflexion. Access Code:  [email or web address]    Consulted and Agree with Plan of Care Patient             Patient will benefit from skilled therapeutic intervention in order to improve the following deficits and impairments:  Pain, Improper body mechanics, Increased fascial restricitons, Abnormal gait, Decreased balance, Difficulty walking, Increased edema, Impaired flexibility, Hypomobility, Decreased strength, Decreased range of motion, Decreased activity tolerance, Decreased mobility, Decreased scar mobility  Visit Diagnosis: Pain in right lower leg  Difficulty in walking, not elsewhere classified  Other abnormalities of gait and mobility     Problem List Patient Active Problem List   Diagnosis Date Noted   Open pilon fracture, right, type III, initial encounter 06/14/2021   S/P small bowel resection 06/02/2021   11:59 AM, 11/08/21 Georges Lynch PT DPT  Physical Therapist with [place]  [facility]  [phone]  [facility] [address] [place], [place], [address] Phone: [phone]   Fax:  [phone]  Name: Yvette Roark MRN: [id number] Date of Birth: 08/14/1971

## 2021-11-09 ENCOUNTER — Other Ambulatory Visit: Payer: Self-pay

## 2021-11-09 ENCOUNTER — Encounter (HOSPITAL_COMMUNITY): Payer: Self-pay | Admitting: Physical Therapy

## 2021-11-09 ENCOUNTER — Ambulatory Visit (HOSPITAL_COMMUNITY): Payer: Commercial Managed Care - PPO | Attending: Student | Admitting: Physical Therapy

## 2021-11-09 DIAGNOSIS — R262 Difficulty in walking, not elsewhere classified: Secondary | ICD-10-CM | POA: Diagnosis present

## 2021-11-09 DIAGNOSIS — M79661 Pain in right lower leg: Secondary | ICD-10-CM | POA: Diagnosis present

## 2021-11-09 DIAGNOSIS — R2689 Other abnormalities of gait and mobility: Secondary | ICD-10-CM | POA: Insufficient documentation

## 2021-11-09 NOTE — Therapy (Signed)
[facility] [address] [place], [place], [address] Phone: [phone]   Fax:  [phone]  Physical Therapy Treatment  Patient Details  Name: Aaron Webster MRN: [id number] Date of Birth: [birth year] Referring Provider (PT): Truitt Merle MD   Encounter Date: 11/09/2021   PT End of Session - 11/09/21 1726     Visit Number 16    Number of Visits 24    Date for PT Re-Evaluation 12/05/21    Authorization Type UMR/ [facility]    PT Start Time 1723    PT Stop Time 1810    PT Time Calculation (min) 47 min    Activity Tolerance Patient tolerated treatment well    Behavior During Therapy [facility] for tasks assessed/performed             History reviewed. No pertinent past medical history.  Past Surgical History:  Procedure Laterality Date   EXTERNAL FIXATION LEG Right 06/02/2021   Procedure: EXTERNAL FIXATION LEG;  Surgeon: Roby Lofts, MD;  [facility]: [facility];  Service: Orthopedics;  Laterality: Right;   EXTERNAL FIXATION REMOVAL Right 06/29/2021   Procedure: REMOVAL EXTERNAL FIXATION LEG;  Surgeon: Roby Lofts, MD;  Location: [facility] OR;  Service: Orthopedics;  Laterality: Right;   I & D EXTREMITY Right 06/02/2021   Procedure: IRRIGATION AND DEBRIDEMENT EXTREMITY;  Surgeon: Roby Lofts, MD;  Location: [facility] OR;  Service: Orthopedics;  Laterality: Right;   LAPAROTOMY N/A 06/02/2021   Procedure: EXPLORATORY LAPAROTOMY;  Surgeon: Violeta Gelinas, MD;  Location: [facility] OR;  Service: General;  Laterality: N/A;   OPEN REDUCTION INTERNAL FIXATION (ORIF) TIBIA/FIBULA FRACTURE Right 06/29/2021   Procedure: OPEN REDUCTION INTERNAL FIXATION (ORIF) PILON FRACTURE;  Surgeon: Roby Lofts, MD;  [facility] OR;  Service: Orthopedics;  Laterality: Right;    There were no vitals filed for this visit.   Subjective Assessment - 11/09/21 1725     Subjective Doing well, walking more wihtout curtch (did not bring it today). Still has some pain in RT knee but feels  better today.    Pertinent History RT tibia ORIF 06/29/21    Limitations Standing;Walking;House hold activities    Currently in Pain? Yes    Pain Score 2     Pain Location Knee    Pain Orientation Right    Pain Descriptors / Indicators Aching    Pain Type Acute pain                               [facility] Adult PT Treatment/Exercise - 11/09/21 0001       Knee/Hip Exercises: Stretches   Gastroc Stretch 3 reps;30 seconds    Gastroc Stretch Limitations slant board      Knee/Hip Exercises: Aerobic   Nustep 5 minutes Lv 3      Knee/Hip Exercises: Machines for Curator machine walk outs FWD/ RETRO 6pl x 10 each      Knee/Hip Exercises: Standing   Lateral Step Up Right;2 sets;10 reps;Hand Hold: 1   BOSU   Forward Step Up Right;2 sets;15 reps;Hand Hold: 1;Step Height: 6"   1st set power up, 2nd set BOSU   Functional Squat 2 sets;10 reps    Functional Squat Limitations to chair    Other Standing Knee Exercises GTB sidestepping 5 RT      Manual Therapy   Manual Therapy Edema management;Joint mobilization    Manual therapy comments Manual complete  separate than rest of tx    Edema Management Retrograde massage for edema control with LE elevated    Joint Mobilization grade II-III A/P ankle DF mobs                       PT Short Term Goals - 10/12/21 1753       PT SHORT TERM GOAL #1   Title Patient will be independent with initial HEP and self-management strategies to improve functional outcomes    Baseline 10/20:  Reports compliance with HEP daily and has been massaging tissue daily.    Status Achieved      PT SHORT TERM GOAL #2   Title Patient will report at least 50% overall improvement in subjective complaint to indicate improvement in ability to perform ADLs.    Baseline Reports 65%    Status Achieved      PT SHORT TERM GOAL #3   Title Patient will improve RT ankle DF AROM to at least 5 degrees for improved functional  mobility and normailized gait mechanics.    Status Achieved               PT Long Term Goals - 10/12/21 1754       PT LONG TERM GOAL #1   Title Patient will improve RT ankle DF AROM to at least 10 degrees for improved functional mobility and normailized gait mechanics.    Baseline Current 5 degrees    Time 12    Period Weeks    Status On-going      PT LONG TERM GOAL #2   Title Patient will improve FOTO score to predicted value to indicate improvement in functional outcomes    Baseline See FOTO    Time 12    Period Weeks    Status On-going      PT LONG TERM GOAL #3   Title Patient will report at least 75% overall improvement in subjective complaint to indicate improvement in ability to perform ADLs.    Baseline Reports 65%    Time 12    Period Weeks    Status On-going      PT LONG TERM GOAL #4   Title Patient will have equal to 5/5 MMT throughout RT ankle to improve ability to perform functional mobility, stair ambulation and ADLs.    Baseline see MMT    Time 12    Period Weeks    Status On-going                   Plan - 11/09/21 1820     Clinical Impression Statement Patient tolerated session well today. Showing improved mobility with ambulation. Still with altered gait mechanics due to limited ankle DF ROM. Performed manual joint mobs and edema message, AROM DF remains limited at 4 degrees. Patient challenged with power ups on BOSU ball. Verbal cues to reduce HHA. Patient will continue to benefit from skilled therapy services to reduce deficits and improve functional ability.    Examination-Activity Limitations Locomotion Level;Stand;Lift;Transfers;Stairs    Examination-Participation Restrictions Community Activity;Occupation;Cleaning;Driving;Yard Work;Shop    Rehab Potential Good    PT Frequency 2x / week    PT Duration 12 weeks    PT Treatment/Interventions ADLs/Self Care Home Management;Biofeedback;Traction;Moist Heat;Stair training;Functional mobility  training;Neuromuscular re-education;Manual techniques;Passive range of motion;Dry needling;Energy conservation;Manual lymph drainage;Patient/family education;Therapeutic activities;Ultrasound;Cryotherapy;Parrafin;Fluidtherapy;Splinting;Spinal Manipulations;Visual/perceptual remediation/compensation;Vasopneumatic Device;Scar mobilization;Compression bandaging;Taping;Joint Manipulations;Orthotic Fit/Training;Balance training;Therapeutic exercise;Contrast Bath;DME Instruction;Electrical Stimulation;Iontophoresis 4mg /ml Dexamethasone;Gait training    PT Next Visit Plan Gait  training withno AD and LE strengthening. Encourage use of compression garment for edema control. Manual and active stretches for mobility. Balance and ambulation on compliant surfaces, eccentric quad strengthening    PT Home Exercise Plan Eval: Ankle band 3 way, seated calf stretch, SLR, bridge; 09/14/21: towel crunch, ankle circles, gastroc stretch, great toe movements, encouraged to elevate for edema control.; 10/11: heel/toe raises, towel Inv/Ev; 10/20: standing heel/toe; 10/27: knee drive on step for dorsiflexion. Access Code: [email or web address]    Consulted and Agree with Plan of Care Patient             Patient will benefit from skilled therapeutic intervention in order to improve the following deficits and impairments:  Pain, Improper body mechanics, Increased fascial restricitons, Abnormal gait, Decreased balance, Difficulty walking, Increased edema, Impaired flexibility, Hypomobility, Decreased strength, Decreased range of motion, Decreased activity tolerance, Decreased mobility, Decreased scar mobility  Visit Diagnosis: Pain in right lower leg  Difficulty in walking, not elsewhere classified  Other abnormalities of gait and mobility     Problem List Patient Active Problem List   Diagnosis Date Noted   Open pilon fracture, right, type III, initial encounter 06/14/2021   S/P small bowel resection 06/02/2021   6:22 PM,  11/09/21 Georges Lynch PT DPT  Physical Therapist with [place]  [facility]  [phone]  [facility] [address] [place], [place], [address] Phone: [phone]   Fax:  [phone]  Name: Aaron Webster MRN: [id number] Date of Birth: 05-16-1971

## 2021-11-14 ENCOUNTER — Ambulatory Visit (HOSPITAL_COMMUNITY): Payer: Commercial Managed Care - PPO | Admitting: Physical Therapy

## 2021-11-14 ENCOUNTER — Other Ambulatory Visit: Payer: Self-pay

## 2021-11-14 DIAGNOSIS — R262 Difficulty in walking, not elsewhere classified: Secondary | ICD-10-CM

## 2021-11-14 DIAGNOSIS — R2689 Other abnormalities of gait and mobility: Secondary | ICD-10-CM

## 2021-11-14 DIAGNOSIS — M79661 Pain in right lower leg: Secondary | ICD-10-CM | POA: Diagnosis not present

## 2021-11-15 NOTE — Therapy (Signed)
[facility] [address] [place], [place], [address] Phone: [phone]   Fax:  [phone]  Physical Therapy Treatment  Patient Details  Name: Aaron Webster MRN: [id number] Date of Birth: [birth year] Referring Provider (PT): Truitt Merle MD   Encounter Date: 11/14/2021   PT End of Session - 11/14/21 1728     Visit Number 17    Number of Visits 24    Date for PT Re-Evaluation 12/05/21    Authorization Type UMR/ [facility]    PT Start Time 1722    PT Stop Time 1803    PT Time Calculation (min) 41 min    Activity Tolerance Patient tolerated treatment well    Behavior During Therapy [facility] for tasks assessed/performed             No past medical history on file.  Past Surgical History:  Procedure Laterality Date   EXTERNAL FIXATION LEG Right 06/02/2021   Procedure: EXTERNAL FIXATION LEG;  Surgeon: Roby Lofts, MD;  Location: [facility] OR;  Service: Orthopedics;  Laterality: Right;   EXTERNAL FIXATION REMOVAL Right 06/29/2021   Procedure: REMOVAL EXTERNAL FIXATION LEG;  Surgeon: Roby Lofts, MD;  Location: [facility] OR;  Service: Orthopedics;  Laterality: Right;   I & D EXTREMITY Right 06/02/2021   Procedure: IRRIGATION AND DEBRIDEMENT EXTREMITY;  Surgeon: Roby Lofts, MD;  Location: [facility] OR;  Service: Orthopedics;  Laterality: Right;   LAPAROTOMY N/A 06/02/2021   Procedure: EXPLORATORY LAPAROTOMY;  Surgeon: Violeta Gelinas, MD;  Location: [facility] OR;  Service: General;  Laterality: N/A;   OPEN REDUCTION INTERNAL FIXATION (ORIF) TIBIA/FIBULA FRACTURE Right 06/29/2021   Procedure: OPEN REDUCTION INTERNAL FIXATION (ORIF) PILON FRACTURE;  Surgeon: Roby Lofts, MD;  Location: [facility] OR;  Service: Orthopedics;  Laterality: Right;    There were no vitals filed for this visit.   Subjective Assessment - 11/14/21 1727     Subjective Patient had MD follow up. Things are going well. He still has swelling. MD says ankle bones are still healing. He is having some  trouble with his knee. He has arthritis and is getting an MRI this week.    Pertinent History RT tibia ORIF 06/29/21    Limitations Standing;Walking;House hold activities    Currently in Pain? No/denies                 11/14/21 0001  Knee/Hip Exercises: Stretches  Gastroc Stretch 3 reps;30 seconds  Gastroc Stretch Limitations steps  Other Knee/Hip Stretches ankle DF MWM on 12 inch box 15 x 5"  Knee/Hip Exercises: Aerobic  Nustep 6 minutes Lv 3  Knee/Hip Exercises: Machines for Strengthening  Cybex Leg Press 5 plates with PF 2 x 10  Other Machine machine walk outs FWD/ RETRO 6pl x 10 each, Machine TKE 5 pl 2 x 10  Knee/Hip Exercises: Standing  Heel Raises 20 reps (2nd set eccentric lowering single leg)  Step Down Right;2 sets;10 reps;Hand Hold: 1;Step Height: 4"  Functional Squat 2 sets;10 reps  Functional Squat Limitations to chair  Other Standing Knee Exercises step ups on BOSU HHA x1, 2 x 10        PT Short Term Goals - 10/12/21 1753       PT SHORT TERM GOAL #1   Title Patient will be independent with initial HEP and self-management strategies to improve functional outcomes    Baseline 10/20:  Reports compliance with HEP daily and has been massaging tissue daily.    Status Achieved  PT SHORT TERM GOAL #2   Title Patient will report at least 50% overall improvement in subjective complaint to indicate improvement in ability to perform ADLs.    Baseline Reports 65%    Status Achieved      PT SHORT TERM GOAL #3   Title Patient will improve RT ankle DF AROM to at least 5 degrees for improved functional mobility and normailized gait mechanics.    Status Achieved               PT Long Term Goals - 10/12/21 1754       PT LONG TERM GOAL #1   Title Patient will improve RT ankle DF AROM to at least 10 degrees for improved functional mobility and normailized gait mechanics.    Baseline Current 5 degrees    Time 12    Period Weeks    Status On-going       PT LONG TERM GOAL #2   Title Patient will improve FOTO score to predicted value to indicate improvement in functional outcomes    Baseline See FOTO    Time 12    Period Weeks    Status On-going      PT LONG TERM GOAL #3   Title Patient will report at least 75% overall improvement in subjective complaint to indicate improvement in ability to perform ADLs.    Baseline Reports 65%    Time 12    Period Weeks    Status On-going      PT LONG TERM GOAL #4   Title Patient will have equal to 5/5 MMT throughout RT ankle to improve ability to perform functional mobility, stair ambulation and ADLs.    Baseline see MMT    Time 12    Period Weeks    Status On-going                   Plan - 11/15/21 1658     Clinical Impression Statement Patient tolerated session well. Introduced Scientist, forensic for Network engineer. Patient continues to be limited by decreased eccentric control with descending from 6-inch step. Remains limited in ankle DF AROM, but improves with MWM stretching. Improved gait mechanics following. Continues to improve WB tolerance weekly. Patient will continue to benefit from skilled therapy services to progress LE strength and ankle mobility for reduce pain and improved gait mechanics.    Examination-Activity Limitations Locomotion Level;Stand;Lift;Transfers;Stairs    Examination-Participation Restrictions Community Activity;Occupation;Cleaning;Driving;Yard Work;Shop    Rehab Potential Good    PT Frequency 2x / week    PT Duration 12 weeks    PT Treatment/Interventions ADLs/Self Care Home Management;Biofeedback;Traction;Moist Heat;Stair training;Functional mobility training;Neuromuscular re-education;Manual techniques;Passive range of motion;Dry needling;Energy conservation;Manual lymph drainage;Patient/family education;Therapeutic activities;Ultrasound;Cryotherapy;Parrafin;Fluidtherapy;Splinting;Spinal Manipulations;Visual/perceptual  remediation/compensation;Vasopneumatic Device;Scar mobilization;Compression bandaging;Taping;Joint Manipulations;Orthotic Fit/Training;Balance training;Therapeutic exercise;Contrast Bath;DME Instruction;Electrical Stimulation;Iontophoresis 4mg /ml Dexamethasone;Gait training    PT Next Visit Plan Encourage use of compression garment for edema control. Manual and active stretches for mobility. Balance and ambulation on compliant surfaces, eccentric quad strengthening    PT Home Exercise Plan Eval: Ankle band 3 way, seated calf stretch, SLR, bridge; 09/14/21: towel crunch, ankle circles, gastroc stretch, great toe movements, encouraged to elevate for edema control.; 10/11: heel/toe raises, towel Inv/Ev; 10/20: standing heel/toe; 10/27: knee drive on step for dorsiflexion. Access Code: [email or web address]    Consulted and Agree with Plan of Care Patient             Patient will benefit from skilled therapeutic intervention in order to improve the  following deficits and impairments:  Pain, Improper body mechanics, Increased fascial restricitons, Abnormal gait, Decreased balance, Difficulty walking, Increased edema, Impaired flexibility, Hypomobility, Decreased strength, Decreased range of motion, Decreased activity tolerance, Decreased mobility, Decreased scar mobility  Visit Diagnosis: Pain in right lower leg  Difficulty in walking, not elsewhere classified  Other abnormalities of gait and mobility     Problem List Patient Active Problem List   Diagnosis Date Noted   Open pilon fracture, right, type III, initial encounter 06/14/2021   S/P small bowel resection 06/02/2021   5:03 PM, 11/15/21 Georges Lynch PT DPT  Physical Therapist with [place]  [facility]  [phone]  [facility] [address] [place], [place], [address] Phone: [phone]   Fax:  [phone]  Name: Aaron Webster MRN: [id number] Date of Birth: October 07, 1971

## 2021-11-15 NOTE — Progress Notes (Signed)
   11/14/21 0001  Knee/Hip Exercises: Stretches  Gastroc Stretch 3 reps;30 seconds  Gastroc Stretch Limitations steps  Other Knee/Hip Stretches ankle DF MWM on 12 inch box 15 x 5"  Knee/Hip Exercises: Aerobic  Nustep 6 minutes Lv 3  Knee/Hip Exercises: Machines for Strengthening  Cybex Leg Press 5 plates with PF 2 x 10  Other Machine machine walk outs FWD/ RETRO 6pl x 10 each, Machine TKE 5 pl 2 x 10  Knee/Hip Exercises: Standing  Heel Raises 20 reps (2nd set eccentric lowering single leg)  Step Down Right;2 sets;10 reps;Hand Hold: 1;Step Height: 4"  Functional Squat 2 sets;10 reps  Functional Squat Limitations to chair  Other Standing Knee Exercises step ups on BOSU HHA x1, 2 x 10

## 2021-11-16 ENCOUNTER — Ambulatory Visit (HOSPITAL_COMMUNITY): Payer: Commercial Managed Care - PPO | Admitting: Physical Therapy

## 2021-11-17 ENCOUNTER — Other Ambulatory Visit: Payer: Self-pay | Admitting: Student

## 2021-11-17 DIAGNOSIS — M2391 Unspecified internal derangement of right knee: Secondary | ICD-10-CM

## 2021-11-17 DIAGNOSIS — M25561 Pain in right knee: Secondary | ICD-10-CM

## 2021-11-21 ENCOUNTER — Ambulatory Visit (HOSPITAL_COMMUNITY): Payer: Commercial Managed Care - PPO | Admitting: Physical Therapy

## 2021-11-21 ENCOUNTER — Other Ambulatory Visit: Payer: Self-pay

## 2021-11-21 DIAGNOSIS — M79661 Pain in right lower leg: Secondary | ICD-10-CM | POA: Diagnosis not present

## 2021-11-21 DIAGNOSIS — R2689 Other abnormalities of gait and mobility: Secondary | ICD-10-CM

## 2021-11-21 DIAGNOSIS — R262 Difficulty in walking, not elsewhere classified: Secondary | ICD-10-CM

## 2021-11-22 NOTE — Therapy (Signed)
[facility] [address] [place], [place], [address] Phone: [phone]   Fax:  [phone]  Physical Therapy Treatment  Patient Details  Name: Aaron Webster MRN: [id number] Date of Birth: Apr 27, 1971 Referring Provider (PT): Truitt Merle MD   Encounter Date: 11/21/2021   PT End of Session - 11/21/21 1720     Visit Number 18    Number of Visits 24    Date for PT Re-Evaluation 12/05/21    Authorization Type UMR/ [facility]    PT Start Time 1718    PT Stop Time 1805    PT Time Calculation (min) 47 min    Activity Tolerance Patient tolerated treatment well    Behavior During Therapy [facility] for tasks assessed/performed             No past medical history on file.  Past Surgical History:  Procedure Laterality Date   EXTERNAL FIXATION LEG Right 06/02/2021   Procedure: EXTERNAL FIXATION LEG;  Surgeon: Roby Lofts, MD;  Location: [facility] OR;  Service: Orthopedics;  Laterality: Right;   EXTERNAL FIXATION REMOVAL Right 06/29/2021   Procedure: REMOVAL EXTERNAL FIXATION LEG;  Surgeon: Roby Lofts, MD;  [facility]: [facility] OR;  Service: Orthopedics;  Laterality: Right;   I & D EXTREMITY Right 06/02/2021   Procedure: IRRIGATION AND DEBRIDEMENT EXTREMITY;  Surgeon: Roby Lofts, MD;  [facility]: [facility] OR;  Service: Orthopedics;  Laterality: Right;   LAPAROTOMY N/A 06/02/2021   Procedure: EXPLORATORY LAPAROTOMY;  Surgeon: Violeta Gelinas, MD;  Location: [place] OR;  Service: General;  Laterality: N/A;   OPEN REDUCTION INTERNAL FIXATION (ORIF) TIBIA/FIBULA FRACTURE Right 06/29/2021   Procedure: OPEN REDUCTION INTERNAL FIXATION (ORIF) PILON FRACTURE;  Surgeon: Roby Lofts, MD;  [facility]: [facility];  Service: Orthopedics;  Laterality: Right;    There were no vitals filed for this visit.   Subjective Assessment - 11/21/21 1721     Subjective Patient went back to work a day. He wore compression socks which he states caused him pain in shin area. He states this has  subsided since yesterday. He is off the rest of the week. He has an MRI on his knee coming up.    Pertinent History RT tibia ORIF 06/29/21    Limitations Standing;Walking;House hold activities    Currently in Pain? No/denies               11/21/21 0001  Knee/Hip Exercises: Stretches  Gastroc Stretch 3 reps;30 seconds  Gastroc Stretch Limitations slant board  Other Knee/Hip Stretches calf stretch at wall 3 x 30"  Knee/Hip Exercises: Aerobic  Recumbent Bike 4 min dynamic warmup LV 2  Knee/Hip Exercises: Machines for Strengthening  Cybex Leg Press 5 plates 2x 10, 6 plates x 10  Other Machine machine walk outs FWD/ RETRO 6pl x 10 each  Knee/Hip Exercises: Standing  Heel Raises Limitations heel/toe raises x10  Forward Step Up Right;2 sets;15 reps;Hand Hold: 2 (on BOSU 2nd set with power ups)  Functional Squat 2 sets;10 reps  Other Standing Knee Exercises heel/ toe walking 1 RT (difficulty toe walking)  Other Standing Knee Exercises GTB sidestepping 4 RT  Forward Lunges Both;1 set;10 reps  Forward Lunges Limitations partial reps  Knee/Hip Exercises: Seated  Other Seated Knee/Hip Exercises seated windshield wiper x20      PT Short Term Goals - 10/12/21 1753       PT SHORT TERM GOAL #1   Title Patient will be independent with initial HEP and self-management strategies  to improve functional outcomes    Baseline 10/20:  Reports compliance with HEP daily and has been massaging tissue daily.    Status Achieved      PT SHORT TERM GOAL #2   Title Patient will report at least 50% overall improvement in subjective complaint to indicate improvement in ability to perform ADLs.    Baseline Reports 65%    Status Achieved      PT SHORT TERM GOAL #3   Title Patient will improve RT ankle DF AROM to at least 5 degrees for improved functional mobility and normailized gait mechanics.    Status Achieved               PT Long Term Goals - 10/12/21 1754       PT LONG TERM GOAL #1    Title Patient will improve RT ankle DF AROM to at least 10 degrees for improved functional mobility and normailized gait mechanics.    Baseline Current 5 degrees    Time 12    Period Weeks    Status On-going      PT LONG TERM GOAL #2   Title Patient will improve FOTO score to predicted value to indicate improvement in functional outcomes    Baseline See FOTO    Time 12    Period Weeks    Status On-going      PT LONG TERM GOAL #3   Title Patient will report at least 75% overall improvement in subjective complaint to indicate improvement in ability to perform ADLs.    Baseline Reports 65%    Time 12    Period Weeks    Status On-going      PT LONG TERM GOAL #4   Title Patient will have equal to 5/5 MMT throughout RT ankle to improve ability to perform functional mobility, stair ambulation and ADLs.    Baseline see MMT    Time 12    Period Weeks    Status On-going                   Plan - 11/22/21 1027     Clinical Impression Statement Continued with established POC for functional LE strength and ankle mobility. Patient tolerated session well and showing good return with previous exercises. Added heel/toe walking to challenge functional PF/DF. Patient did well with heel walking, but continues to demo significant PF weakness with attempted toe walking. PF appears to influence reported leg pain near tibia. Transitioned to continued focus on supported bilateral heel raises for ankle PF strengthening. Patient will continue to benefit from skilled therapy services to reduce ongoing deficits and improve function.    Examination-Activity Limitations Locomotion Level;Stand;Lift;Transfers;Stairs    Examination-Participation Restrictions Community Activity;Occupation;Cleaning;Driving;Yard Work;Shop    Rehab Potential Good    PT Frequency 2x / week    PT Duration 12 weeks    PT Treatment/Interventions ADLs/Self Care Home Management;Biofeedback;Traction;Moist Heat;Stair  training;Functional mobility training;Neuromuscular re-education;Manual techniques;Passive range of motion;Dry needling;Energy conservation;Manual lymph drainage;Patient/family education;Therapeutic activities;Ultrasound;Cryotherapy;Parrafin;Fluidtherapy;Splinting;Spinal Manipulations;Visual/perceptual remediation/compensation;Vasopneumatic Device;Scar mobilization;Compression bandaging;Taping;Joint Manipulations;Orthotic Fit/Training;Balance training;Therapeutic exercise;Contrast Bath;DME Instruction;Electrical Stimulation;Iontophoresis 4mg /ml Dexamethasone;Gait training    PT Next Visit Plan lunging, squatting, leg press. Active ankle DF mobility and calf stretching    PT Home Exercise Plan Eval: Ankle band 3 way, seated calf stretch, SLR, bridge; 09/14/21: towel crunch, ankle circles, gastroc stretch, great toe movements, encouraged to elevate for edema control.; 10/11: heel/toe raises, towel Inv/Ev; 10/20: standing heel/toe; 10/27: knee drive on step for dorsiflexion. Access Code: [facility]  Consulted and Agree with Plan of Care Patient             Patient will benefit from skilled therapeutic intervention in order to improve the following deficits and impairments:  Pain, Improper body mechanics, Increased fascial restricitons, Abnormal gait, Decreased balance, Difficulty walking, Increased edema, Impaired flexibility, Hypomobility, Decreased strength, Decreased range of motion, Decreased activity tolerance, Decreased mobility, Decreased scar mobility  Visit Diagnosis: Pain in right lower leg  Difficulty in walking, not elsewhere classified  Other abnormalities of gait and mobility     Problem List Patient Active Problem List   Diagnosis Date Noted   Open pilon fracture, right, type III, initial encounter 06/14/2021   S/P small bowel resection 06/02/2021   10:41 AM, 11/22/21 Georges Lynch PT DPT  Physical Therapist with [place]  [facility]  [phone]   [facility]  [facility] [address] [place], [place], [address] Phone: [phone]   Fax:  [phone]  Name: Aaron Webster MRN: [id number] Date of Birth: 06-18-1971

## 2021-11-23 ENCOUNTER — Ambulatory Visit (HOSPITAL_COMMUNITY): Payer: Commercial Managed Care - PPO | Admitting: Physical Therapy

## 2021-11-23 ENCOUNTER — Encounter (HOSPITAL_COMMUNITY): Payer: Self-pay | Admitting: Physical Therapy

## 2021-11-23 ENCOUNTER — Other Ambulatory Visit: Payer: Self-pay

## 2021-11-23 DIAGNOSIS — M79661 Pain in right lower leg: Secondary | ICD-10-CM | POA: Diagnosis not present

## 2021-11-23 DIAGNOSIS — R262 Difficulty in walking, not elsewhere classified: Secondary | ICD-10-CM

## 2021-11-23 DIAGNOSIS — R2689 Other abnormalities of gait and mobility: Secondary | ICD-10-CM

## 2021-11-23 NOTE — Therapy (Signed)
[facility] [address] [place], [place], [address] Phone: [phone]   Fax:  [phone]  Physical Therapy Treatment  Patient Details  Name: Aaron Webster MRN: [id number] Date of Birth: 05-11-1971 Referring Provider (PT): Truitt Merle MD   Encounter Date: 11/23/2021   PT End of Session - 11/23/21 1731     Visit Number 19    Number of Visits 24    Date for PT Re-Evaluation 12/05/21    Authorization Type UMR/ [employer]    PT Start Time 1725    PT Stop Time 1815    PT Time Calculation (min) 50 min    Activity Tolerance Patient tolerated treatment well    Behavior During Therapy [facility] for tasks assessed/performed             History reviewed. No pertinent past medical history.  Past Surgical History:  Procedure Laterality Date   EXTERNAL FIXATION LEG Right 06/02/2021   Procedure: EXTERNAL FIXATION LEG;  Surgeon: Roby Lofts, MD;  Location: [facility] OR;  Service: Orthopedics;  Laterality: Right;   EXTERNAL FIXATION REMOVAL Right 06/29/2021   Procedure: REMOVAL EXTERNAL FIXATION LEG;  Surgeon: Roby Lofts, MD;  Location: [facility] OR;  Service: Orthopedics;  Laterality: Right;   I & D EXTREMITY Right 06/02/2021   Procedure: IRRIGATION AND DEBRIDEMENT EXTREMITY;  Surgeon: Roby Lofts, MD;  Location: [facility] OR;  Service: Orthopedics;  Laterality: Right;   LAPAROTOMY N/A 06/02/2021   Procedure: EXPLORATORY LAPAROTOMY;  Surgeon: Violeta Gelinas, MD;  Location: [facility] OR;  Service: General;  Laterality: N/A;   OPEN REDUCTION INTERNAL FIXATION (ORIF) TIBIA/FIBULA FRACTURE Right 06/29/2021   Procedure: OPEN REDUCTION INTERNAL FIXATION (ORIF) PILON FRACTURE;  Surgeon: Roby Lofts, MD;  Location: [facility] OR;  Service: Orthopedics;  Laterality: Right;    There were no vitals filed for this visit.   Subjective Assessment - 11/23/21 1731     Subjective Doing better, pain not as bad. Feels this was due to high compression stockings.    Pertinent History RT  tibia ORIF 06/29/21    Limitations Standing;Walking;House hold activities    Currently in Pain? Yes    Pain Score 3     Pain Location Leg    Pain Orientation Right;Anterior    Pain Descriptors / Indicators Aching                [facility] PT Assessment - 11/23/21 0001       AROM   Right Ankle Dorsiflexion 6                           [facility] Adult PT Treatment/Exercise - 11/23/21 0001       Knee/Hip Exercises: Stretches   Gastroc Stretch 3 reps;30 seconds    Gastroc Stretch Limitations step    Other Knee/Hip Stretches calf stretch at wall 3 x 30"      Knee/Hip Exercises: Aerobic   Recumbent Bike 4 min dynamic warmup LV 3      Knee/Hip Exercises: Machines for Strengthening   Cybex Knee Extension 1 plate 2 x 10 DL, 1 x 10 SL    Cybex Knee Flexion 4 plates 2 x 10 DL, 1 x 10 SL    Cybex Leg Press 6 plates 2 x 10      Knee/Hip Exercises: Standing   Heel Raises 20 reps    Heel Raises Limitations from step    Functional Squat 2 sets;10 reps  SLS 4 x 20 sec on foam int HHA    Gait Training 2RT in clinic, cues for smooth heel toe transition and even stride    Other Standing Knee Exercises supported lunges x10 each                       PT Short Term Goals - 10/12/21 1753       PT SHORT TERM GOAL #1   Title Patient will be independent with initial HEP and self-management strategies to improve functional outcomes    Baseline 10/20:  Reports compliance with HEP daily and has been massaging tissue daily.    Status Achieved      PT SHORT TERM GOAL #2   Title Patient will report at least 50% overall improvement in subjective complaint to indicate improvement in ability to perform ADLs.    Baseline Reports 65%    Status Achieved      PT SHORT TERM GOAL #3   Title Patient will improve RT ankle DF AROM to at least 5 degrees for improved functional mobility and normailized gait mechanics.    Status Achieved               PT Long Term Goals -  10/12/21 1754       PT LONG TERM GOAL #1   Title Patient will improve RT ankle DF AROM to at least 10 degrees for improved functional mobility and normailized gait mechanics.    Baseline Current 5 degrees    Time 12    Period Weeks    Status On-going      PT LONG TERM GOAL #2   Title Patient will improve FOTO score to predicted value to indicate improvement in functional outcomes    Baseline See FOTO    Time 12    Period Weeks    Status On-going      PT LONG TERM GOAL #3   Title Patient will report at least 75% overall improvement in subjective complaint to indicate improvement in ability to perform ADLs.    Baseline Reports 65%    Time 12    Period Weeks    Status On-going      PT LONG TERM GOAL #4   Title Patient will have equal to 5/5 MMT throughout RT ankle to improve ability to perform functional mobility, stair ambulation and ADLs.    Baseline see MMT    Time 12    Period Weeks    Status On-going                   Plan - 11/23/21 1823     Clinical Impression Statement Patient showing steady progress to therapy goals. Remains limited in active ankle DF but showing improved gait mechanics when cued. Progressed LE and knee strengthening with added knee extension and curl machine. Patient also showing improved static balance on compliant surface but does require intermittent HHA. Will continue to progress functional activity and LE strengthening as tolerated for improved gait and performance with ADLs.    Examination-Activity Limitations Locomotion Level;Stand;Lift;Transfers;Stairs    Examination-Participation Restrictions Community Activity;Occupation;Cleaning;Driving;Yard Work;Shop    Rehab Potential Good    PT Frequency 2x / week    PT Duration 12 weeks    PT Treatment/Interventions ADLs/Self Care Home Management;Biofeedback;Traction;Moist Heat;Stair training;Functional mobility training;Neuromuscular re-education;Manual techniques;Passive range of motion;Dry  needling;Energy conservation;Manual lymph drainage;Patient/family education;Therapeutic activities;Ultrasound;Cryotherapy;Parrafin;Fluidtherapy;Splinting;Spinal Manipulations;Visual/perceptual remediation/compensation;Vasopneumatic Device;Scar mobilization;Compression bandaging;Taping;Joint Manipulations;Orthotic Fit/Training;Balance training;Therapeutic exercise;Contrast Bath;DME Instruction;Electrical Stimulation;Iontophoresis 4mg /ml Dexamethasone;Gait training  PT Next Visit Plan lunging, squatting, leg press. Active ankle DF mobility and calf stretching    PT Home Exercise Plan Eval: Ankle band 3 way, seated calf stretch, SLR, bridge; 09/14/21: towel crunch, ankle circles, gastroc stretch, great toe movements, encouraged to elevate for edema control.; 10/11: heel/toe raises, towel Inv/Ev; 10/20: standing heel/toe; 10/27: knee drive on step for dorsiflexion. Access Code: [email or web address]    Consulted and Agree with Plan of Care Patient             Patient will benefit from skilled therapeutic intervention in order to improve the following deficits and impairments:  Pain, Improper body mechanics, Increased fascial restricitons, Abnormal gait, Decreased balance, Difficulty walking, Increased edema, Impaired flexibility, Hypomobility, Decreased strength, Decreased range of motion, Decreased activity tolerance, Decreased mobility, Decreased scar mobility  Visit Diagnosis: Pain in right lower leg  Difficulty in walking, not elsewhere classified  Other abnormalities of gait and mobility     Problem List Patient Active Problem List   Diagnosis Date Noted   Open pilon fracture, right, type III, initial encounter 06/14/2021   S/P small bowel resection 06/02/2021   6:24 PM, 11/23/21 Georges Lynch PT DPT  Physical Therapist with [place]  [facility]  [phone]  [facility] [address] [place], [place], [address] Phone: [phone]    Fax:  [phone]  Name: Aaron Webster MRN: [id number] Date of Birth: 08-Jun-1971

## 2021-11-28 ENCOUNTER — Ambulatory Visit (HOSPITAL_COMMUNITY): Payer: Commercial Managed Care - PPO | Admitting: Physical Therapy

## 2021-11-28 ENCOUNTER — Encounter (HOSPITAL_COMMUNITY): Payer: Self-pay | Admitting: Physical Therapy

## 2021-11-28 ENCOUNTER — Other Ambulatory Visit: Payer: Self-pay

## 2021-11-28 DIAGNOSIS — M79661 Pain in right lower leg: Secondary | ICD-10-CM

## 2021-11-28 DIAGNOSIS — R2689 Other abnormalities of gait and mobility: Secondary | ICD-10-CM

## 2021-11-28 DIAGNOSIS — R262 Difficulty in walking, not elsewhere classified: Secondary | ICD-10-CM

## 2021-11-28 NOTE — Therapy (Signed)
[facility] [address] [place], [place], [address] Phone: [phone]   Fax:  [phone]  Physical Therapy Treatment  Patient Details  Name: Aaron Webster MRN: [id number] Date of Birth: 1971-11-18 Referring Provider (PT): Truitt Merle MD   Encounter Date: 11/28/2021   PT End of Session - 11/28/21 1655     Visit Number 20    Number of Visits 24    Date for PT Re-Evaluation 12/05/21    Authorization Type UMR/ [facility]    PT Start Time 1645    PT Stop Time 1735    PT Time Calculation (min) 50 min    Activity Tolerance Patient tolerated treatment well    Behavior During Therapy [facility] for tasks assessed/performed             History reviewed. No pertinent past medical history.  Past Surgical History:  Procedure Laterality Date   EXTERNAL FIXATION LEG Right 06/02/2021   Procedure: EXTERNAL FIXATION LEG;  Surgeon: Roby Lofts, MD;  Location: [facility] OR;  Service: Orthopedics;  Laterality: Right;   EXTERNAL FIXATION REMOVAL Right 06/29/2021   Procedure: REMOVAL EXTERNAL FIXATION LEG;  Surgeon: Roby Lofts, MD;  Location: [facility] OR;  Service: Orthopedics;  Laterality: Right;   I & D EXTREMITY Right 06/02/2021   Procedure: IRRIGATION AND DEBRIDEMENT EXTREMITY;  Surgeon: Roby Lofts, MD;  Location: [facility] OR;  Service: Orthopedics;  Laterality: Right;   LAPAROTOMY N/A 06/02/2021   Procedure: EXPLORATORY LAPAROTOMY;  Surgeon: Violeta Gelinas, MD;  Location: [facility] OR;  Service: General;  Laterality: N/A;   OPEN REDUCTION INTERNAL FIXATION (ORIF) TIBIA/FIBULA FRACTURE Right 06/29/2021   Procedure: OPEN REDUCTION INTERNAL FIXATION (ORIF) PILON FRACTURE;  Surgeon: Roby Lofts, MD;  Location: [facility] OR;  Service: Orthopedics;  Laterality: Right;    There were no vitals filed for this visit.   Subjective Assessment - 11/28/21 1649     Subjective Back at work today. Been walking a lot. Doing well overall, just mild pain in shin area today.    Pertinent  History RT tibia ORIF 06/29/21    Limitations Standing;Walking;House hold activities    Currently in Pain? Yes    Pain Score 3     Pain Location Leg    Pain Orientation Right;Anterior    Pain Descriptors / Indicators Aching    Pain Type Acute pain                               [facility] Adult PT Treatment/Exercise - 11/28/21 0001       Knee/Hip Exercises: Stretches   Gastroc Stretch 3 reps;30 seconds    Gastroc Stretch Limitations step      Knee/Hip Exercises: Aerobic   Recumbent Bike 5 min dynamic warmup LV 3      Knee/Hip Exercises: Machines for Strengthening   Cybex Leg Press 6 plates 2 x 10    Other Machine machine calf raise 4 pl x 15, 5 plates 2 x 15      Knee/Hip Exercises: Standing   Heel Raises 20 reps    Heel Raises Limitations from step    Forward Lunges Both;1 set;10 reps    Forward Lunges Limitations to doubled foam pad for depth cue and HHA    Stairs 5RT, 7 inch, reciprocal, 1 hand rail    SLS 4 x 20 sec on foam int HHA    Gait Training 2RT in clinic, cues for  smooth heel toe transition and even stride    Other Standing Knee Exercises knee drive 10 x 5" on 2nd step                       PT Short Term Goals - 10/12/21 1753       PT SHORT TERM GOAL #1   Title Patient will be independent with initial HEP and self-management strategies to improve functional outcomes    Baseline 10/20:  Reports compliance with HEP daily and has been massaging tissue daily.    Status Achieved      PT SHORT TERM GOAL #2   Title Patient will report at least 50% overall improvement in subjective complaint to indicate improvement in ability to perform ADLs.    Baseline Reports 65%    Status Achieved      PT SHORT TERM GOAL #3   Title Patient will improve RT ankle DF AROM to at least 5 degrees for improved functional mobility and normailized gait mechanics.    Status Achieved               PT Long Term Goals - 10/12/21 1754       PT LONG  TERM GOAL #1   Title Patient will improve RT ankle DF AROM to at least 10 degrees for improved functional mobility and normailized gait mechanics.    Baseline Current 5 degrees    Time 12    Period Weeks    Status On-going      PT LONG TERM GOAL #2   Title Patient will improve FOTO score to predicted value to indicate improvement in functional outcomes    Baseline See FOTO    Time 12    Period Weeks    Status On-going      PT LONG TERM GOAL #3   Title Patient will report at least 75% overall improvement in subjective complaint to indicate improvement in ability to perform ADLs.    Baseline Reports 65%    Time 12    Period Weeks    Status On-going      PT LONG TERM GOAL #4   Title Patient will have equal to 5/5 MMT throughout RT ankle to improve ability to perform functional mobility, stair ambulation and ADLs.    Baseline see MMT    Time 12    Period Weeks    Status On-going                   Plan - 11/28/21 1740     Clinical Impression Statement Patient demos ongoing ankle DF restriction, though gait has significantly improved, especially with verbal cues. Ongoing PF weakness with attempted single leg calf raises. Patient cued on proper form and mechanics with lunging, and to reduce HHA as able. Patient continues to be challenged with single limb balance on compliant surfaces. Discussed more focused HEP to address remaining deficits in preparation for reassessment next session.    Examination-Activity Limitations Locomotion Level;Stand;Lift;Transfers;Stairs    Examination-Participation Restrictions Community Activity;Occupation;Cleaning;Driving;Yard Work;Shop    Rehab Potential Good    PT Frequency 2x / week    PT Duration 12 weeks    PT Treatment/Interventions ADLs/Self Care Home Management;Biofeedback;Traction;Moist Heat;Stair training;Functional mobility training;Neuromuscular re-education;Manual techniques;Passive range of motion;Dry needling;Energy  conservation;Manual lymph drainage;Patient/family education;Therapeutic activities;Ultrasound;Cryotherapy;Parrafin;Fluidtherapy;Splinting;Spinal Manipulations;Visual/perceptual remediation/compensation;Vasopneumatic Device;Scar mobilization;Compression bandaging;Taping;Joint Manipulations;Orthotic Fit/Training;Balance training;Therapeutic exercise;Contrast Bath;DME Instruction;Electrical Stimulation;Iontophoresis 4mg /ml Dexamethasone;Gait training    PT Next Visit Plan Reassess next visit    PT  Home Exercise Plan Eval: Ankle band 3 way, seated calf stretch, SLR, bridge; 09/14/21: towel crunch, ankle circles, gastroc stretch, great toe movements, encouraged to elevate for edema control.; 10/11: heel/toe raises, towel Inv/Ev; 10/20: standing heel/toe; 10/27: knee drive on step for dorsiflexion. Access Code: [email or web address]    Consulted and Agree with Plan of Care Patient             Patient will benefit from skilled therapeutic intervention in order to improve the following deficits and impairments:  Pain, Improper body mechanics, Increased fascial restricitons, Abnormal gait, Decreased balance, Difficulty walking, Increased edema, Impaired flexibility, Hypomobility, Decreased strength, Decreased range of motion, Decreased activity tolerance, Decreased mobility, Decreased scar mobility  Visit Diagnosis: Pain in right lower leg  Difficulty in walking, not elsewhere classified  Other abnormalities of gait and mobility     Problem List Patient Active Problem List   Diagnosis Date Noted   Open pilon fracture, right, type III, initial encounter 06/14/2021   S/P small bowel resection 06/02/2021   5:58 PM, 11/28/21 Georges Lynch PT DPT  Physical Therapist with [place]  [facility]  [phone]  [facility] [address] [place], [place], [address] Phone: [phone]   Fax:  [phone]  Name: Aaron Webster MRN: [id number] Date of  Birth: April 02, 1971

## 2021-11-30 ENCOUNTER — Ambulatory Visit (HOSPITAL_COMMUNITY): Payer: Commercial Managed Care - PPO | Admitting: Physical Therapy

## 2021-12-05 ENCOUNTER — Ambulatory Visit (HOSPITAL_COMMUNITY): Payer: Commercial Managed Care - PPO

## 2021-12-05 ENCOUNTER — Telehealth (HOSPITAL_COMMUNITY): Payer: Self-pay

## 2021-12-05 ENCOUNTER — Other Ambulatory Visit: Payer: Self-pay

## 2021-12-05 ENCOUNTER — Ambulatory Visit
Admission: RE | Admit: 2021-12-05 | Discharge: 2021-12-05 | Disposition: A | Discharge status: Home / Self Care | Payer: Commercial Managed Care - PPO | Source: Ambulatory Visit | Attending: Student | Admitting: Student

## 2021-12-05 DIAGNOSIS — M2391 Unspecified internal derangement of right knee: Secondary | ICD-10-CM

## 2021-12-05 DIAGNOSIS — M25561 Pain in right knee: Secondary | ICD-10-CM

## 2021-12-05 NOTE — Telephone Encounter (Signed)
Pt called at 5:28pm he will not make it back in town today for this apptment he is still in [place], [place]

## 2021-12-07 ENCOUNTER — Other Ambulatory Visit: Payer: Self-pay

## 2021-12-07 ENCOUNTER — Ambulatory Visit (HOSPITAL_COMMUNITY): Payer: Commercial Managed Care - PPO

## 2021-12-07 ENCOUNTER — Encounter (HOSPITAL_COMMUNITY): Payer: Self-pay

## 2021-12-07 DIAGNOSIS — R2689 Other abnormalities of gait and mobility: Secondary | ICD-10-CM

## 2021-12-07 DIAGNOSIS — M79661 Pain in right lower leg: Secondary | ICD-10-CM | POA: Diagnosis not present

## 2021-12-07 DIAGNOSIS — R262 Difficulty in walking, not elsewhere classified: Secondary | ICD-10-CM

## 2021-12-07 NOTE — Therapy (Signed)
[place], [place], [address] Phone: [phone]   Fax:  [phone]  Physical Therapy Treatment  Patient Details  Name: Aaron Webster MRN: [id number] Date of Birth: 06-09-1971 Referring Provider (PT): Katha Hamming MD   Encounter Date: 12/07/2021   PT End of Session - 12/07/21 1744     Visit Number 21    Number of Visits 24    Date for PT Re-Evaluation 12/05/21    Authorization Type UMR/ UHC    PT Start Time 1739    PT Stop Time 1817    PT Time Calculation (min) 38 min    Activity Tolerance Patient tolerated treatment well    Behavior During Therapy [facility] for tasks assessed/performed             History reviewed. No pertinent past medical history.  Past Surgical History:  Procedure Laterality Date   EXTERNAL FIXATION LEG Right 06/02/2021   Procedure: EXTERNAL FIXATION LEG;  Surgeon: Shona Needles, MD;  Location: [place];  Service: Orthopedics;  Laterality: Right;   EXTERNAL FIXATION REMOVAL Right 06/29/2021   Procedure: REMOVAL EXTERNAL FIXATION LEG;  Surgeon: Shona Needles, MD;  Location: [place];  Service: Orthopedics;  Laterality: Right;   I & D EXTREMITY Right 06/02/2021   Procedure: IRRIGATION AND DEBRIDEMENT EXTREMITY;  Surgeon: Shona Needles, MD;  Location: [place];  Service: Orthopedics;  Laterality: Right;   LAPAROTOMY N/A 06/02/2021   Procedure: EXPLORATORY LAPAROTOMY;  Surgeon: Georganna Skeans, MD;  Location: [place];  Service: General;  Laterality: N/A;   OPEN REDUCTION INTERNAL FIXATION (ORIF) TIBIA/FIBULA FRACTURE Right 06/29/2021   Procedure: OPEN REDUCTION INTERNAL FIXATION (ORIF) PILON FRACTURE;  Surgeon: Shona Needles, MD;  Location: [place];  Service: Orthopedics;  Laterality: Right;    There were no vitals filed for this visit.   Subjective Assessment - 12/07/21 1738     Subjective MRI done last Tuesday, findings included trauma to tibia and possible ACL tear.  Reports he continues to  have swelling distal Rt LE.  Does own compression garments, one feels uncomfortable to wear long periods of time.  Pt has minimal pain that goes away.    Pertinent History RT tibia ORIF 06/29/21    Currently in Pain? No/denies                [facility] PT Assessment - 12/07/21 0001       Assessment   Medical Diagnosis RT tibia ORIF    Referring Provider (PT) Katha Hamming MD    Onset Date/Surgical Date 06/29/21    Next MD Visit Depends upon MRI findings      Precautions   Precautions None      Restrictions   Weight Bearing Restrictions No    LLE Weight Bearing Weight bearing as tolerated      Observation/Other Assessments   Focus on Therapeutic Outcomes (FOTO)  77% functional   was 65% function     AROM   AROM Assessment Site Ankle    Right/Left Knee Right    Right/Left Ankle Right    Right Ankle Dorsiflexion 9   was 6   Right Ankle Plantar Flexion 36   was 40   Right Ankle Inversion 18    Right Ankle Eversion 12      Strength   Strength Assessment Site Hip;Knee;Ankle    Right/Left Hip Right    Right Hip Flexion 5/5    Right Hip Extension 4+/5  was 4+   Right Hip ABduction 5/5   was 4+   Right/Left Knee Right    Right Knee Extension 5/5    Right/Left Ankle Right    Right Ankle Dorsiflexion 5/5   was 4+   Right Ankle Plantar Flexion 4/5   was 4/5   Right Ankle Inversion 5/5   was 4+/5   Right Ankle Eversion 5/5   was 4+/53     Ambulation/Gait   Ambulation/Gait Yes                           [facility] Adult PT Treatment/Exercise - 12/07/21 0001       Ambulation/Gait   Ambulation/Gait Assistance 6: Modified independent (Device/Increase time)    Ambulation Distance (Feet) 446 Feet    Assistive device None    Gait Pattern Decreased dorsiflexion - right    Ambulation Surface Level;Indoor    Stairs Yes    Stairs Assistance 6: Modified independent (Device/Increase time)    Stair Management Technique Alternating pattern;No rails    Number of Stairs 4    5RT   Height of Stairs 7    Gait Comments 2MWT      Knee/Hip Exercises: Stretches   Knee: Self-Stretch to increase Flexion 5 reps;10 seconds    Gastroc Stretch 3 reps;30 seconds      Knee/Hip Exercises: Aerobic   Recumbent Bike 5 min dynamic warmup LV 3      Knee/Hip Exercises: Standing   Heel Raises 20 reps    Heel Raises Limitations from step   2nd set 2up and 1 down slow, cueingfor posture   Stairs 5RT, 7 inch, reciprocal, 1 hand rail    SLS 5" max    Gait Training 2MWT                       PT Short Term Goals - 10/12/21 1753       PT SHORT TERM GOAL #1   Title Patient will be independent with initial HEP and self-management strategies to improve functional outcomes    Baseline 10/20:  Reports compliance with HEP daily and has been massaging tissue daily.    Status Achieved      PT SHORT TERM GOAL #2   Title Patient will report at least 50% overall improvement in subjective complaint to indicate improvement in ability to perform ADLs.    Baseline Reports 65%    Status Achieved      PT SHORT TERM GOAL #3   Title Patient will improve RT ankle DF AROM to at least 5 degrees for improved functional mobility and normailized gait mechanics.    Status Achieved               PT Long Term Goals - 12/07/21 1755       PT LONG TERM GOAL #1   Title Patient will improve RT ankle DF AROM to at least 10 degrees for improved functional mobility and normailized gait mechanics.    Baseline 12/29: 9 degrees was 5 degrees    Status Partially Met      PT LONG TERM GOAL #2   Title Patient will improve FOTO score to predicted value to indicate improvement in functional outcomes    Baseline 12/29:  77%, was 63%, FOTO predicated value was 79%    Status Partially Met      PT LONG TERM GOAL #3   Title Patient will report at  least 75% overall improvement in subjective complaint to indicate improvement in ability to perform ADLs.    Baseline 12/29:  Reports 85-90% was  65% 1 month ago    Time 12    Status Achieved      PT LONG TERM GOAL #4   Title Patient will have equal to 5/5 MMT throughout RT ankle to improve ability to perform functional mobility, stair ambulation and ADLs.    Baseline see MMT; all 5/5 except for glut max 4+/5 and gastroc strength at 4/5    Time 12    Status Partially Met                   Plan - 12/07/21 1822     Clinical Impression Statement Reviewed goals with the following findings:  Pt reports compliance wiht HEP.  Pt able to ambulate quicker cadence with proper gait mechanics and no AD.  Pt able to demonstrate ability to recipricate stairs with good form, does shoes decreased eccentric control descending though pt reoprts some issues found Rt knee in recent MRI.  Strength all 5/5 except for glut max at 4+ and continues to demonstrate gastroc weakness.  Educated to complete heel raises at home wiht 2up and 1 down to progress strengthening as well as adding SLS to daily HEP.  ROM improved with dorsiflexion though decreased PF.  Pt c/o edema present feels limited range from standing all day.  Reviewed benefits with compression garments for daily use to assist with edema.  Improved self perceived functional ability noted wiht FOTO score improved from 63% to 77%.  No further skilled intervention required, appropriate to DC to HEP.    Examination-Activity Limitations Locomotion Level;Stand;Lift;Transfers;Stairs    Examination-Participation Restrictions Community Activity;Occupation;Cleaning;Driving;Yard Work;Shop    Stability/Clinical Decision Making Stable/Uncomplicated    Clinical Decision Making Low    Rehab Potential Good    PT Frequency 2x / week    PT Duration 12 weeks    PT Treatment/Interventions ADLs/Self Care Home Management;Biofeedback;Traction;Moist Heat;Stair training;Functional mobility training;Neuromuscular re-education;Manual techniques;Passive range of motion;Dry needling;Energy conservation;Manual lymph  drainage;Patient/family education;Therapeutic activities;Ultrasound;Cryotherapy;Parrafin;Fluidtherapy;Splinting;Spinal Manipulations;Visual/perceptual remediation/compensation;Vasopneumatic Device;Scar mobilization;Compression bandaging;Taping;Joint Manipulations;Orthotic Fit/Training;Balance training;Therapeutic exercise;Contrast Bath;DME Instruction;Electrical Stimulation;Iontophoresis 36m/ml Dexamethasone;Gait training    PT Next Visit Plan DC to HEP    PT Home Exercise Plan Eval: Ankle band 3 way, seated calf stretch, SLR, bridge; 09/14/21: towel crunch, ankle circles, gastroc stretch, great toe movements, encouraged to elevate for edema control.; 10/11: heel/toe raises, towel Inv/Ev; 10/20: standing heel/toe; 10/27: knee drive on step for dorsiflexion. Access Code: [email or web address] 12/29: SLS and heel riases with 2up and 1 down.    Consulted and Agree with Plan of Care Patient             Patient will benefit from skilled therapeutic intervention in order to improve the following deficits and impairments:  Pain, Improper body mechanics, Increased fascial restricitons, Abnormal gait, Decreased balance, Difficulty walking, Increased edema, Impaired flexibility, Hypomobility, Decreased strength, Decreased range of motion, Decreased activity tolerance, Decreased mobility, Decreased scar mobility  Visit Diagnosis: Pain in right lower leg  Difficulty in walking, not elsewhere classified  Other abnormalities of gait and mobility     Problem List Patient Active Problem List   Diagnosis Date Noted   Open pilon fracture, right, type III, initial encounter 06/14/2021   S/P small bowel resection 06/02/2021   CIhor Austin LPTA/CLT; CBIS [phone] CAldona Lento PTA 12/07/2021, 6:29 PM  [id number]  [place], [place], [address] Phone: [phone]   Fax:  [phone]  Name: Aaron Webster MRN: [id number] Date of Birth:  01/21/1971

## 2021-12-12 ENCOUNTER — Encounter (HOSPITAL_COMMUNITY): Payer: Commercial Managed Care - PPO | Admitting: Physical Therapy

## 2022-02-01 ENCOUNTER — Other Ambulatory Visit: Payer: Self-pay | Admitting: Student

## 2022-02-01 DIAGNOSIS — S82871F Displaced pilon fracture of right tibia, subsequent encounter for open fracture type IIIA, IIIB, or IIIC with routine healing: Secondary | ICD-10-CM

## 2022-02-05 ENCOUNTER — Other Ambulatory Visit: Payer: Self-pay | Admitting: Student

## 2022-02-05 DIAGNOSIS — S82871F Displaced pilon fracture of right tibia, subsequent encounter for open fracture type IIIA, IIIB, or IIIC with routine healing: Secondary | ICD-10-CM

## 2022-02-20 ENCOUNTER — Ambulatory Visit
Admission: RE | Admit: 2022-02-20 | Discharge: 2022-02-20 | Disposition: A | Discharge status: Home / Self Care | Payer: Commercial Managed Care - PPO | Source: Ambulatory Visit | Attending: Student | Admitting: Student

## 2022-02-20 ENCOUNTER — Other Ambulatory Visit: Payer: Self-pay

## 2022-02-20 DIAGNOSIS — S82871F Displaced pilon fracture of right tibia, subsequent encounter for open fracture type IIIA, IIIB, or IIIC with routine healing: Secondary | ICD-10-CM

## 2022-09-10 ENCOUNTER — Other Ambulatory Visit: Payer: Self-pay | Admitting: Student

## 2022-09-10 DIAGNOSIS — S82871F Displaced pilon fracture of right tibia, subsequent encounter for open fracture type IIIA, IIIB, or IIIC with routine healing: Secondary | ICD-10-CM

## 2022-10-01 ENCOUNTER — Ambulatory Visit
Admission: RE | Admit: 2022-10-01 | Discharge: 2022-10-01 | Disposition: A | Discharge status: Home / Self Care | Payer: Commercial Managed Care - PPO | Source: Ambulatory Visit | Attending: Student | Admitting: Student

## 2022-10-01 DIAGNOSIS — S82871F Displaced pilon fracture of right tibia, subsequent encounter for open fracture type IIIA, IIIB, or IIIC with routine healing: Secondary | ICD-10-CM

## 2022-10-17 ENCOUNTER — Ambulatory Visit: Payer: Self-pay | Admitting: Student

## 2022-10-17 DIAGNOSIS — S82871K Displaced pilon fracture of right tibia, subsequent encounter for closed fracture with nonunion: Secondary | ICD-10-CM

## 2022-10-30 NOTE — Pre-Procedure Instructions (Signed)
Surgical Instructions    Your procedure is scheduled on Wednesday, November 29th.  Report to [facility] Main Entrance "A" at 05:30 A.M., then check in with the Admitting office.  Call this number if you have problems the morning of surgery:  [phone]   If you have any questions prior to your surgery date call [phone]: Open Monday-Friday 8am-4pm    Remember:  Do not eat or drink after midnight the night before your surgery     Take these medicines the morning of surgery with A SIP OF WATER: none  As of today, STOP taking any Aspirin (unless otherwise instructed by your surgeon) Aleve, Naproxen, Ibuprofen, Motrin, Advil, Goody's, BC's, all herbal medications, fish oil, and all vitamins.                     Do NOT Smoke (Tobacco/Vaping) for 24 hours prior to your procedure.  If you use a CPAP at night, you may bring your mask/headgear for your overnight stay.   Contacts, glasses, piercing's, hearing aid's, dentures or partials may not be worn into surgery, please bring cases for these belongings.    For patients admitted to the hospital, discharge time will be determined by your treatment team.   Patients discharged the day of surgery will not be allowed to drive home, and someone needs to stay with them for 24 hours.  SURGICAL WAITING ROOM VISITATION Patients having surgery or a procedure may have no more than 2 support people in the waiting area - these visitors may rotate.   Children under the age of 31 must have an adult with them who is not the patient. If the patient needs to stay at the hospital during part of their recovery, the visitor guidelines for inpatient rooms apply. Pre-op nurse will coordinate an appropriate time for 1 support person to accompany patient in pre-op.  This support person may not rotate.   Please refer to the [facility] website for the visitor guidelines for Inpatients (after your surgery is over and you are in a regular room).    Special  instructions:   [place]- Preparing For Surgery  Before surgery, you can play an important role. Because skin is not sterile, your skin needs to be as free of germs as possible. You can reduce the number of germs on your skin by washing with CHG (chlorahexidine gluconate) Soap before surgery.  CHG is an antiseptic cleaner which kills germs and bonds with the skin to continue killing germs even after washing.    Oral Hygiene is also important to reduce your risk of infection.  Remember - BRUSH YOUR TEETH THE MORNING OF SURGERY WITH YOUR REGULAR TOOTHPASTE  Please do not use if you have an allergy to CHG or antibacterial soaps. If your skin becomes reddened/irritated stop using the CHG.  Do not shave (including legs and underarms) for at least 48 hours prior to first CHG shower. It is OK to shave your face.  Please follow these instructions carefully.   Shower the NIGHT BEFORE SURGERY and the MORNING OF SURGERY  If you chose to wash your hair, wash your hair first as usual with your normal shampoo.  After you shampoo, rinse your hair and body thoroughly to remove the shampoo.  Use CHG Soap as you would any other liquid soap. You can apply CHG directly to the skin and wash gently with a scrungie or a clean washcloth.   Apply the CHG Soap to your body ONLY FROM  THE NECK DOWN.  Do not use on open wounds or open sores. Avoid contact with your eyes, ears, mouth and genitals (private parts). Wash Face and genitals (private parts)  with your normal soap.   Wash thoroughly, paying special attention to the area where your surgery will be performed.  Thoroughly rinse your body with warm water from the neck down.  DO NOT shower/wash with your normal soap after using and rinsing off the CHG Soap.  Pat yourself dry with a CLEAN TOWEL.  Wear CLEAN PAJAMAS to bed the night before surgery  Place CLEAN SHEETS on your bed the night before your surgery  DO NOT SLEEP WITH PETS.   Day of  Surgery: Take a shower with CHG soap. Do not wear jewelry  Do not wear lotions, powders, colognes, or deodorant. Men may shave face and neck. Do not bring valuables to the hospital. [facility] is not responsible for any belongings or valuables.  Wear Clean/Comfortable clothing the morning of surgery Remember to brush your teeth WITH YOUR REGULAR TOOTHPASTE.   Please read over the following fact sheets that you were given.    If you received a COVID test during your pre-op visit  it is requested that you wear a mask when out in public, stay away from anyone that may not be feeling well and notify your surgeon if you develop symptoms. If you have been in contact with anyone that has tested positive in the last 10 days please notify you surgeon.

## 2022-10-31 ENCOUNTER — Encounter (HOSPITAL_COMMUNITY)
Admission: RE | Admit: 2022-10-31 | Discharge: 2022-10-31 | Disposition: A | Discharge status: Home / Self Care | Payer: Commercial Managed Care - PPO | Source: Ambulatory Visit | Attending: Student | Admitting: Student

## 2022-10-31 ENCOUNTER — Other Ambulatory Visit: Payer: Self-pay

## 2022-10-31 ENCOUNTER — Encounter (HOSPITAL_COMMUNITY): Payer: Self-pay

## 2022-10-31 VITALS — BP 142/93 | HR 65 | Temp 98.3°F | Resp 17 | Ht 75.0 in | Wt 222.0 lb

## 2022-10-31 DIAGNOSIS — S82871K Displaced pilon fracture of right tibia, subsequent encounter for closed fracture with nonunion: Secondary | ICD-10-CM | POA: Diagnosis not present

## 2022-10-31 DIAGNOSIS — Z01818 Encounter for other preprocedural examination: Secondary | ICD-10-CM | POA: Diagnosis not present

## 2022-10-31 LAB — CBC WITH DIFFERENTIAL/PLATELET
Abs Immature Granulocytes: 0.02 10*3/uL (ref 0.00–0.07)
Basophils Absolute: 0 10*3/uL (ref 0.0–0.1)
Basophils Relative: 0 %
Eosinophils Absolute: 0 10*3/uL (ref 0.0–0.5)
Eosinophils Relative: 1 %
HCT: 43 % (ref 39.0–52.0)
Hemoglobin: 14.3 g/dL (ref 13.0–17.0)
Immature Granulocytes: 0 %
Lymphocytes Relative: 24 %
Lymphs Abs: 1.3 10*3/uL (ref 0.7–4.0)
MCH: 29.3 pg (ref 26.0–34.0)
MCHC: 33.3 g/dL (ref 30.0–36.0)
MCV: 88.1 fL (ref 80.0–100.0)
Monocytes Absolute: 0.3 10*3/uL (ref 0.1–1.0)
Monocytes Relative: 6 %
Neutro Abs: 3.6 10*3/uL (ref 1.7–7.7)
Neutrophils Relative %: 69 %
Platelets: 287 10*3/uL (ref 150–400)
RBC: 4.88 MIL/uL (ref 4.22–5.81)
RDW: 12.7 % (ref 11.5–15.5)
WBC: 5.3 10*3/uL (ref 4.0–10.5)
nRBC: 0 % (ref 0.0–0.2)

## 2022-10-31 LAB — BASIC METABOLIC PANEL
Anion gap: 7 (ref 5–15)
BUN: 14 mg/dL (ref 6–20)
CO2: 25 mmol/L (ref 22–32)
Calcium: 9.1 mg/dL (ref 8.9–10.3)
Chloride: 106 mmol/L (ref 98–111)
Creatinine, Ser: 1.75 mg/dL — ABNORMAL HIGH (ref 0.61–1.24)
GFR, Estimated: 46 mL/min — ABNORMAL LOW (ref >60)
Glucose, Bld: 85 mg/dL (ref 70–99)
Potassium: 4.1 mmol/L (ref 3.5–5.1)
Sodium: 138 mmol/L (ref 135–145)

## 2022-10-31 LAB — SEDIMENTATION RATE: Sed Rate: 4 mm/hr (ref 0–16)

## 2022-10-31 LAB — PROTIME-INR
INR: 1 (ref 0.8–1.2)
Prothrombin Time: 12.8 seconds (ref 11.4–15.2)

## 2022-10-31 LAB — C-REACTIVE PROTEIN: CRP: 0.8 mg/dL (ref <1.0)

## 2022-10-31 NOTE — Progress Notes (Signed)
Abnormal labs in PAT today. Dr. Jena Gauss was notified via [employer].

## 2022-10-31 NOTE — Progress Notes (Signed)
PCP - denies Cardiologist - denies  PPM/ICD - denies   Chest x-ray - 06/02/21 EKG - 5 years ago per pt (normal per pt) Stress Test - denies ECHO - denies Cardiac Cath - denies  Sleep Study - denies   DM- denies  Last dose of GLP1 agonist-  n/a   ASA/Blood Thinner Instructions: n/a   ERAS Protcol - No, NPO   COVID TEST- n/a   Anesthesia review: no  Patient denies shortness of breath, fever, cough and chest pain at PAT appointment   All instructions explained to the patient, with a verbal understanding of the material. Patient agrees to go over the instructions while at home for a better understanding. The opportunity to ask questions was provided.

## 2022-11-07 ENCOUNTER — Inpatient Hospital Stay (HOSPITAL_COMMUNITY)
Admission: RE | Admit: 2022-11-07 | Discharge: 2022-11-09 | DRG: 493 | Disposition: A | Discharge status: Home Health | Payer: Commercial Managed Care - PPO | Attending: Student | Admitting: Student

## 2022-11-07 ENCOUNTER — Encounter (HOSPITAL_COMMUNITY)
Admission: RE | Disposition: A | Discharge status: Home Health | Payer: Self-pay | Source: Home / Self Care | Attending: Student

## 2022-11-07 ENCOUNTER — Other Ambulatory Visit: Payer: Self-pay

## 2022-11-07 ENCOUNTER — Inpatient Hospital Stay (HOSPITAL_COMMUNITY): Payer: Commercial Managed Care - PPO | Admitting: Vascular Surgery

## 2022-11-07 ENCOUNTER — Inpatient Hospital Stay (HOSPITAL_COMMUNITY): Payer: Commercial Managed Care - PPO

## 2022-11-07 ENCOUNTER — Encounter (HOSPITAL_COMMUNITY): Payer: Self-pay | Admitting: Student

## 2022-11-07 ENCOUNTER — Inpatient Hospital Stay (HOSPITAL_COMMUNITY): Payer: Commercial Managed Care - PPO | Admitting: Anesthesiology

## 2022-11-07 DIAGNOSIS — T84622A Infection and inflammatory reaction due to internal fixation device of right tibia, initial encounter: Secondary | ICD-10-CM

## 2022-11-07 DIAGNOSIS — M86161 Other acute osteomyelitis, right tibia and fibula: Secondary | ICD-10-CM

## 2022-11-07 DIAGNOSIS — B9620 Unspecified Escherichia coli [E. coli] as the cause of diseases classified elsewhere: Secondary | ICD-10-CM | POA: Diagnosis present

## 2022-11-07 DIAGNOSIS — M8668 Other chronic osteomyelitis, other site: Secondary | ICD-10-CM | POA: Diagnosis present

## 2022-11-07 DIAGNOSIS — M86661 Other chronic osteomyelitis, right tibia and fibula: Secondary | ICD-10-CM | POA: Diagnosis not present

## 2022-11-07 DIAGNOSIS — S82201K Unspecified fracture of shaft of right tibia, subsequent encounter for closed fracture with nonunion: Secondary | ICD-10-CM | POA: Diagnosis not present

## 2022-11-07 DIAGNOSIS — S82871K Displaced pilon fracture of right tibia, subsequent encounter for closed fracture with nonunion: Secondary | ICD-10-CM | POA: Diagnosis not present

## 2022-11-07 DIAGNOSIS — Z885 Allergy status to narcotic agent status: Secondary | ICD-10-CM | POA: Diagnosis not present

## 2022-11-07 DIAGNOSIS — Z720 Tobacco use: Secondary | ICD-10-CM | POA: Diagnosis not present

## 2022-11-07 DIAGNOSIS — A499 Bacterial infection, unspecified: Secondary | ICD-10-CM

## 2022-11-07 DIAGNOSIS — M869 Osteomyelitis, unspecified: Secondary | ICD-10-CM

## 2022-11-07 DIAGNOSIS — T847XXA Infection and inflammatory reaction due to other internal orthopedic prosthetic devices, implants and grafts, initial encounter: Principal | ICD-10-CM | POA: Diagnosis present

## 2022-11-07 HISTORY — PX: I & D EXTREMITY: SHX5045

## 2022-11-07 HISTORY — PX: HARDWARE REMOVAL: SHX979

## 2022-11-07 LAB — BASIC METABOLIC PANEL
Anion gap: 8 (ref 5–15)
BUN: 8 mg/dL (ref 6–20)
CO2: 23 mmol/L (ref 22–32)
Calcium: 8.2 mg/dL — ABNORMAL LOW (ref 8.9–10.3)
Chloride: 104 mmol/L (ref 98–111)
Creatinine, Ser: 0.98 mg/dL (ref 0.61–1.24)
GFR, Estimated: >60 mL/min (ref >60)
Glucose, Bld: 106 mg/dL — ABNORMAL HIGH (ref 70–99)
Potassium: 4.2 mmol/L (ref 3.5–5.1)
Sodium: 135 mmol/L (ref 135–145)

## 2022-11-07 SURGERY — REMOVAL, HARDWARE
Anesthesia: General | Laterality: Right

## 2022-11-07 MED ORDER — OXYCODONE HCL 5 MG PO TABS
5.0000 mg | ORAL_TABLET | ORAL | Status: DC | PRN
  Administered 2022-11-07: 10 mg via ORAL
  Filled 2022-11-07 (×2): qty 2

## 2022-11-07 MED ORDER — DEXAMETHASONE SODIUM PHOSPHATE 10 MG/ML IJ SOLN
INTRAMUSCULAR | Status: DC | PRN
  Administered 2022-11-07: 5 mg via INTRAVENOUS

## 2022-11-07 MED ORDER — OXYCODONE HCL 5 MG PO TABS: 5.0000 mg | ORAL_TABLET | Freq: Once | ORAL | Status: DC | PRN

## 2022-11-07 MED ORDER — METOCLOPRAMIDE HCL 5 MG PO TABS: 5.0000 mg | ORAL_TABLET | Freq: Three times a day (TID) | ORAL | Status: DC | PRN

## 2022-11-07 MED ORDER — METHOCARBAMOL 500 MG PO TABS: 500.0000 mg | ORAL_TABLET | Freq: Four times a day (QID) | ORAL | Status: DC | PRN

## 2022-11-07 MED ORDER — HYDROMORPHONE HCL 1 MG/ML IJ SOLN
INTRAMUSCULAR | Status: DC | PRN
  Administered 2022-11-07: .5 mg via INTRAVENOUS

## 2022-11-07 MED ORDER — VANCOMYCIN HCL 1500 MG/300ML IV SOLN
1500.0000 mg | Freq: Once | INTRAVENOUS | Status: AC
  Administered 2022-11-07: 1500 mg via INTRAVENOUS
  Filled 2022-11-07: qty 300

## 2022-11-07 MED ORDER — METOCLOPRAMIDE HCL 5 MG/ML IJ SOLN: 5.0000 mg | Freq: Three times a day (TID) | INTRAMUSCULAR | Status: DC | PRN

## 2022-11-07 MED ORDER — POLYETHYLENE GLYCOL 3350 17 G PO PACK: 17.0000 g | PACK | Freq: Every day | ORAL | Status: DC | PRN

## 2022-11-07 MED ORDER — SODIUM CHLORIDE 0.9 % IV SOLN: INTRAVENOUS | Status: DC

## 2022-11-07 MED ORDER — PROPOFOL 10 MG/ML IV BOLUS
INTRAVENOUS | Status: AC
  Filled 2022-11-07: qty 20

## 2022-11-07 MED ORDER — AMISULPRIDE (ANTIEMETIC) 5 MG/2ML IV SOLN: 10.0000 mg | Freq: Once | INTRAVENOUS | Status: DC | PRN

## 2022-11-07 MED ORDER — ACETAMINOPHEN 500 MG PO TABS
1000.0000 mg | ORAL_TABLET | Freq: Once | ORAL | Status: AC
  Administered 2022-11-07: 1000 mg via ORAL
  Filled 2022-11-07: qty 2

## 2022-11-07 MED ORDER — EPHEDRINE SULFATE-NACL 50-0.9 MG/10ML-% IV SOSY
PREFILLED_SYRINGE | INTRAVENOUS | Status: DC | PRN
  Administered 2022-11-07: 5 mg via INTRAVENOUS

## 2022-11-07 MED ORDER — DOCUSATE SODIUM 100 MG PO CAPS
100.0000 mg | ORAL_CAPSULE | Freq: Two times a day (BID) | ORAL | Status: DC
  Administered 2022-11-07 – 2022-11-09 (×4): 100 mg via ORAL
  Filled 2022-11-07 (×4): qty 1

## 2022-11-07 MED ORDER — METHOCARBAMOL 1000 MG/10ML IJ SOLN: 500.0000 mg | Freq: Four times a day (QID) | INTRAVENOUS | Status: DC | PRN

## 2022-11-07 MED ORDER — HYDROMORPHONE HCL 1 MG/ML IJ SOLN: 0.5000 mg | INTRAMUSCULAR | Status: DC | PRN

## 2022-11-07 MED ORDER — OXYCODONE HCL 5 MG PO TABS
10.0000 mg | ORAL_TABLET | ORAL | Status: DC | PRN
  Administered 2022-11-07: 10 mg via ORAL

## 2022-11-07 MED ORDER — ONDANSETRON HCL 4 MG/2ML IJ SOLN: 4.0000 mg | Freq: Four times a day (QID) | INTRAMUSCULAR | Status: DC | PRN

## 2022-11-07 MED ORDER — HYDROMORPHONE HCL 1 MG/ML IJ SOLN
INTRAMUSCULAR | Status: AC
  Filled 2022-11-07: qty 0.5

## 2022-11-07 MED ORDER — HYDRALAZINE HCL 10 MG PO TABS: 10.0000 mg | ORAL_TABLET | Freq: Four times a day (QID) | ORAL | Status: DC | PRN

## 2022-11-07 MED ORDER — TOBRAMYCIN SULFATE 1.2 G IJ SOLR
INTRAMUSCULAR | Status: AC
  Filled 2022-11-07: qty 1.2

## 2022-11-07 MED ORDER — KETAMINE HCL 50 MG/5ML IJ SOSY
PREFILLED_SYRINGE | INTRAMUSCULAR | Status: AC
  Filled 2022-11-07: qty 5

## 2022-11-07 MED ORDER — VANCOMYCIN HCL 1000 MG IV SOLR
INTRAVENOUS | Status: DC | PRN
  Administered 2022-11-07: 1000 mg

## 2022-11-07 MED ORDER — FENTANYL CITRATE (PF) 250 MCG/5ML IJ SOLN
INTRAMUSCULAR | Status: AC
  Filled 2022-11-07: qty 5

## 2022-11-07 MED ORDER — ONDANSETRON HCL 4 MG/2ML IJ SOLN: 4.0000 mg | Freq: Once | INTRAMUSCULAR | Status: DC | PRN

## 2022-11-07 MED ORDER — ONDANSETRON HCL 4 MG/2ML IJ SOLN
INTRAMUSCULAR | Status: DC | PRN
  Administered 2022-11-07: 4 mg via INTRAVENOUS

## 2022-11-07 MED ORDER — HYDROMORPHONE HCL 1 MG/ML IJ SOLN
INTRAMUSCULAR | Status: AC
  Filled 2022-11-07: qty 1

## 2022-11-07 MED ORDER — KETAMINE HCL 10 MG/ML IJ SOLN
INTRAMUSCULAR | Status: DC | PRN
  Administered 2022-11-07: 25 mg via INTRAVENOUS

## 2022-11-07 MED ORDER — LACTATED RINGERS IV SOLN: INTRAVENOUS | Status: DC

## 2022-11-07 MED ORDER — ORAL CARE MOUTH RINSE: 15.0000 mL | Freq: Once | OROMUCOSAL | Status: AC

## 2022-11-07 MED ORDER — TOBRAMYCIN SULFATE 1.2 G IJ SOLR
INTRAMUSCULAR | Status: DC | PRN
  Administered 2022-11-07: 1.2 g

## 2022-11-07 MED ORDER — FENTANYL CITRATE (PF) 250 MCG/5ML IJ SOLN
INTRAMUSCULAR | Status: DC | PRN
  Administered 2022-11-07: 50 ug via INTRAVENOUS
  Administered 2022-11-07: 100 ug via INTRAVENOUS
  Administered 2022-11-07 (×2): 50 ug via INTRAVENOUS

## 2022-11-07 MED ORDER — PROPOFOL 10 MG/ML IV BOLUS
INTRAVENOUS | Status: DC | PRN
  Administered 2022-11-07 (×2): 100 mg via INTRAVENOUS
  Administered 2022-11-07: 200 mg via INTRAVENOUS

## 2022-11-07 MED ORDER — ASPIRIN 81 MG PO TBEC
81.0000 mg | DELAYED_RELEASE_TABLET | Freq: Every day | ORAL | Status: DC
  Administered 2022-11-08 – 2022-11-09 (×2): 81 mg via ORAL
  Filled 2022-11-07 (×2): qty 1

## 2022-11-07 MED ORDER — HYDROMORPHONE HCL 1 MG/ML IJ SOLN
0.2500 mg | INTRAMUSCULAR | Status: DC | PRN
  Administered 2022-11-07: 0.5 mg via INTRAVENOUS

## 2022-11-07 MED ORDER — SODIUM CHLORIDE 0.9 % IR SOLN
Status: DC | PRN
  Administered 2022-11-07: 3000 mL

## 2022-11-07 MED ORDER — MIDAZOLAM HCL 2 MG/2ML IJ SOLN
INTRAMUSCULAR | Status: AC
  Filled 2022-11-07: qty 2

## 2022-11-07 MED ORDER — 0.9 % SODIUM CHLORIDE (POUR BTL) OPTIME
TOPICAL | Status: DC | PRN
  Administered 2022-11-07: 1000 mL

## 2022-11-07 MED ORDER — LIDOCAINE 2% (20 MG/ML) 5 ML SYRINGE
INTRAMUSCULAR | Status: DC | PRN
  Administered 2022-11-07: 60 mg via INTRAVENOUS

## 2022-11-07 MED ORDER — VANCOMYCIN HCL 1500 MG/300ML IV SOLN
1500.0000 mg | Freq: Two times a day (BID) | INTRAVENOUS | Status: DC
  Administered 2022-11-07: 1500 mg via INTRAVENOUS
  Filled 2022-11-07 (×2): qty 300

## 2022-11-07 MED ORDER — SODIUM CHLORIDE 0.9 % IV SOLN
2.0000 g | INTRAVENOUS | Status: DC
  Administered 2022-11-07 – 2022-11-08 (×2): 2 g via INTRAVENOUS
  Filled 2022-11-07 (×2): qty 20

## 2022-11-07 MED ORDER — VANCOMYCIN HCL 1000 MG IV SOLR
INTRAVENOUS | Status: AC
  Filled 2022-11-07: qty 20

## 2022-11-07 MED ORDER — KETOROLAC TROMETHAMINE 30 MG/ML IJ SOLN: 30.0000 mg | Freq: Once | INTRAMUSCULAR | Status: DC | PRN

## 2022-11-07 MED ORDER — MEPERIDINE HCL 25 MG/ML IJ SOLN: 6.2500 mg | INTRAMUSCULAR | Status: DC | PRN

## 2022-11-07 MED ORDER — ACETAMINOPHEN 500 MG PO TABS
1000.0000 mg | ORAL_TABLET | Freq: Four times a day (QID) | ORAL | Status: DC
  Administered 2022-11-07 – 2022-11-09 (×9): 1000 mg via ORAL
  Filled 2022-11-07 (×10): qty 2

## 2022-11-07 MED ORDER — MIDAZOLAM HCL 2 MG/2ML IJ SOLN
INTRAMUSCULAR | Status: DC | PRN
  Administered 2022-11-07: 2 mg via INTRAVENOUS

## 2022-11-07 MED ORDER — CHLORHEXIDINE GLUCONATE 0.12 % MT SOLN
15.0000 mL | Freq: Once | OROMUCOSAL | Status: AC
  Administered 2022-11-07: 15 mL via OROMUCOSAL
  Filled 2022-11-07: qty 15

## 2022-11-07 MED ORDER — SODIUM CHLORIDE 0.9 % IV SOLN: 2.0000 g | INTRAVENOUS | Status: DC

## 2022-11-07 MED ORDER — VANCOMYCIN HCL 1000 MG IV SOLR: 1000.0000 mg | Freq: Once | INTRAVENOUS | Status: DC

## 2022-11-07 MED ORDER — ONDANSETRON HCL 4 MG PO TABS: 4.0000 mg | ORAL_TABLET | Freq: Four times a day (QID) | ORAL | Status: DC | PRN

## 2022-11-07 MED ORDER — OXYCODONE HCL 5 MG/5ML PO SOLN: 5.0000 mg | Freq: Once | ORAL | Status: DC | PRN

## 2022-11-07 SURGICAL SUPPLY — 75 items
BAG COUNTER SPONGE SURGICOUNT (BAG) ×1 IMPLANT
BANDAGE ESMARK 6X9 LF (GAUZE/BANDAGES/DRESSINGS) ×1 IMPLANT
BNDG COHESIVE 4X5 TAN STRL (GAUZE/BANDAGES/DRESSINGS) ×1 IMPLANT
BNDG COHESIVE 6X5 TAN STRL LF (GAUZE/BANDAGES/DRESSINGS) ×1 IMPLANT
BNDG ELASTIC 4X5.8 VLCR STR LF (GAUZE/BANDAGES/DRESSINGS) ×1 IMPLANT
BNDG ELASTIC 6X5.8 VLCR STR LF (GAUZE/BANDAGES/DRESSINGS) ×1 IMPLANT
BNDG ESMARK 6X9 LF (GAUZE/BANDAGES/DRESSINGS) ×1
BNDG GAUZE DERMACEA FLUFF 4 (GAUZE/BANDAGES/DRESSINGS) ×2 IMPLANT
BRUSH SCRUB EZ PLAIN DRY (MISCELLANEOUS) ×2 IMPLANT
CHLORAPREP W/TINT 26 (MISCELLANEOUS) ×1 IMPLANT
COVER MAYO STAND STRL (DRAPES) ×1 IMPLANT
COVER SURGICAL LIGHT HANDLE (MISCELLANEOUS) ×2 IMPLANT
CUFF TOURN SGL QUICK 18X4 (TOURNIQUET CUFF) IMPLANT
CUFF TOURN SGL QUICK 24 (TOURNIQUET CUFF)
CUFF TOURN SGL QUICK 34 (TOURNIQUET CUFF)
CUFF TRNQT CYL 24X4X16.5-23 (TOURNIQUET CUFF) IMPLANT
CUFF TRNQT CYL 34X4.125X (TOURNIQUET CUFF) IMPLANT
DRAPE C-ARM 42X72 X-RAY (DRAPES) IMPLANT
DRAPE C-ARMOR (DRAPES) ×1 IMPLANT
DRAPE ORTHO SPLIT 77X108 STRL (DRAPES) ×1
DRAPE SURG 17X23 STRL (DRAPES) ×1 IMPLANT
DRAPE SURG ORHT 6 SPLT 77X108 (DRAPES) ×1 IMPLANT
DRAPE U-SHAPE 47X51 STRL (DRAPES) ×1 IMPLANT
DRSG ADAPTIC 3X8 NADH LF (GAUZE/BANDAGES/DRESSINGS) ×1 IMPLANT
DRSG MEPITEL 4X7.2 (GAUZE/BANDAGES/DRESSINGS) IMPLANT
ELECT REM PT RETURN 9FT ADLT (ELECTROSURGICAL) ×1
ELECTRODE REM PT RTRN 9FT ADLT (ELECTROSURGICAL) ×1 IMPLANT
EVACUATOR 1/8 PVC DRAIN (DRAIN) IMPLANT
EXTRACTOR BROKEN SCREW 3 (INSTRUMENTS) IMPLANT
GAUZE PAD ABD 8X10 STRL (GAUZE/BANDAGES/DRESSINGS) IMPLANT
GAUZE SPONGE 4X4 12PLY STRL (GAUZE/BANDAGES/DRESSINGS) ×1 IMPLANT
GLOVE BIO SURGEON STRL SZ 6.5 (GLOVE) ×3 IMPLANT
GLOVE BIO SURGEON STRL SZ7.5 (GLOVE) ×4 IMPLANT
GLOVE BIOGEL PI IND STRL 6.5 (GLOVE) ×1 IMPLANT
GLOVE BIOGEL PI IND STRL 7.5 (GLOVE) ×1 IMPLANT
GOWN STRL REUS W/ TWL LRG LVL3 (GOWN DISPOSABLE) ×2 IMPLANT
GOWN STRL REUS W/TWL LRG LVL3 (GOWN DISPOSABLE) ×2
HANDPIECE INTERPULSE COAX TIP (DISPOSABLE)
KIT BASIN OR (CUSTOM PROCEDURE TRAY) ×1 IMPLANT
KIT TURNOVER KIT B (KITS) ×1 IMPLANT
MANIFOLD NEPTUNE II (INSTRUMENTS) ×1 IMPLANT
NDL 22X1.5 STRL (OR ONLY) (MISCELLANEOUS) IMPLANT
NEEDLE 22X1.5 STRL (OR ONLY) (MISCELLANEOUS) IMPLANT
NS IRRIG 1000ML POUR BTL (IV SOLUTION) ×1 IMPLANT
PACK ORTHO EXTREMITY (CUSTOM PROCEDURE TRAY) ×1 IMPLANT
PAD ARMBOARD 7.5X6 YLW CONV (MISCELLANEOUS) ×2 IMPLANT
PAD CAST 4YDX4 CTTN HI CHSV (CAST SUPPLIES) IMPLANT
PADDING CAST COTTON 4X4 STRL (CAST SUPPLIES) ×2
PADDING CAST COTTON 6X4 STRL (CAST SUPPLIES) ×3 IMPLANT
SET CYSTO W/LG BORE CLAMP LF (SET/KITS/TRAYS/PACK) IMPLANT
SET HNDPC FAN SPRY TIP SCT (DISPOSABLE) IMPLANT
SPONGE T-LAP 18X18 ~~LOC~~+RFID (SPONGE) ×1 IMPLANT
STAPLER VISISTAT 35W (STAPLE) IMPLANT
STOCKINETTE IMPERVIOUS LG (DRAPES) ×1 IMPLANT
STRIP CLOSURE SKIN 1/2X4 (GAUZE/BANDAGES/DRESSINGS) IMPLANT
SUCTION FRAZIER HANDLE 10FR (MISCELLANEOUS) ×1
SUCTION TUBE FRAZIER 10FR DISP (MISCELLANEOUS) IMPLANT
SUT ETHILON 2 0 FS 18 (SUTURE) ×2 IMPLANT
SUT ETHILON 3 0 PS 1 (SUTURE) ×2 IMPLANT
SUT MNCRL AB 3-0 PS2 18 (SUTURE) ×1 IMPLANT
SUT MON AB 2-0 CT1 36 (SUTURE) ×1 IMPLANT
SUT PDS AB 0 CT 36 (SUTURE) IMPLANT
SUT PDS AB 2-0 CT1 27 (SUTURE) IMPLANT
SUT VIC AB 0 CT1 27 (SUTURE) ×2
SUT VIC AB 0 CT1 27XBRD ANBCTR (SUTURE) IMPLANT
SUT VIC AB 2-0 CT1 27 (SUTURE) ×2
SUT VIC AB 2-0 CT1 TAPERPNT 27 (SUTURE) IMPLANT
SWAB CULTURE ESWAB REG 1ML (MISCELLANEOUS) IMPLANT
SYR CONTROL 10ML LL (SYRINGE) IMPLANT
TOWEL GREEN STERILE (TOWEL DISPOSABLE) ×2 IMPLANT
TOWEL GREEN STERILE FF (TOWEL DISPOSABLE) ×2 IMPLANT
TUBE CONNECTING 12X1/4 (SUCTIONS) ×1 IMPLANT
UNDERPAD 30X36 HEAVY ABSORB (UNDERPADS AND DIAPERS) ×1 IMPLANT
WATER STERILE IRR 1000ML POUR (IV SOLUTION) ×2 IMPLANT
YANKAUER SUCT BULB TIP NO VENT (SUCTIONS) ×1 IMPLANT

## 2022-11-07 NOTE — Plan of Care (Signed)
  Problem: Education: Goal: Knowledge of General Education information will improve Description: Including pain rating scale, medication(s)/side effects and non-pharmacologic comfort measures Outcome: Progressing   Problem: Activity: Goal: Risk for activity intolerance will decrease Outcome: Progressing   Problem: Nutrition: Goal: Adequate nutrition will be maintained Outcome: Progressing   Problem: Coping: Goal: Level of anxiety will decrease Outcome: Progressing   Problem: Pain Managment: Goal: General experience of comfort will improve Outcome: Progressing   Problem: Safety: Goal: Ability to remain free from injury will improve Outcome: Progressing   Problem: Education: Goal: Knowledge of General Education information will improve Description: Including pain rating scale, medication(s)/side effects and non-pharmacologic comfort measures Outcome: Progressing   Problem: Coping: Goal: Level of anxiety will decrease Outcome: Progressing
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
## 2022-11-07 NOTE — Interval H&P Note (Signed)
History and Physical Interval Note:  11/07/2022 7:29 AM  Aaron Webster  has presented today for surgery, with the diagnosis of Hardware infection/nonunion.  The various methods of treatment have been discussed with the patient and family. After consideration of risks, benefits and other options for treatment, the patient has consented to  Procedure(s): HARDWARE REMOVAL RIGHT TIBIA (Right) IRRIGATION AND DEBRIDEMENT EXTREMITY (Right) as a surgical intervention.  The patient's history has been reviewed, patient examined, no change in status, stable for surgery.  I have reviewed the patient's chart and labs.  Questions were answered to the patient's satisfaction.     Caryn Bee P [facility]

## 2022-11-07 NOTE — Anesthesia Postprocedure Evaluation (Signed)
Anesthesia Post Note  Patient: Marsh Heckler  Procedure(s) Performed: HARDWARE REMOVAL RIGHT TIBIA (Right) IRRIGATION AND DEBRIDEMENT EXTREMITY (Right)     Patient location during evaluation: PACU Anesthesia Type: General Level of consciousness: awake and alert, oriented and patient cooperative Pain management: pain level controlled Vital Signs Assessment: post-procedure vital signs reviewed and stable Respiratory status: spontaneous breathing, nonlabored ventilation and respiratory function stable Cardiovascular status: blood pressure returned to baseline and stable Postop Assessment: no apparent nausea or vomiting Anesthetic complications: no   No notable events documented.  Last Vitals:  Vitals:   11/07/22 1200 11/07/22 1215  BP: 122/85   Pulse: (!) 59   Resp: 13   Temp:  37.1 C  SpO2: 96%     Last Pain:  Vitals:   11/07/22 1215  TempSrc:   PainSc: 0-No pain                 Lannie Fields
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
## 2022-11-07 NOTE — Transfer of Care (Signed)
Immediate Anesthesia Transfer of Care Note  Patient: Aaron Webster  Procedure(s) Performed: HARDWARE REMOVAL RIGHT TIBIA (Right) IRRIGATION AND DEBRIDEMENT EXTREMITY (Right)  Patient Location: PACU  Anesthesia Type:General  Level of Consciousness: awake and patient cooperative  Airway & Oxygen Therapy: Patient Spontanous Breathing and Patient connected to face mask oxygen  Post-op Assessment: Report given to RN, Post -op Vital signs reviewed and stable, and Patient moving all extremities  Post vital signs: Reviewed and stable  Last Vitals:  Vitals Value Taken Time  BP 135/99 11/07/22 1100  Temp 37.1 C 11/07/22 1058  Pulse 63 11/07/22 1102  Resp 10 11/07/22 1102  SpO2 96 % 11/07/22 1102  Vitals shown include unvalidated device data.  Last Pain:  Vitals:   11/07/22 1058  TempSrc:   PainSc: 0-No pain         Complications: No notable events documented.
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
## 2022-11-07 NOTE — Anesthesia Procedure Notes (Signed)
Procedure Name: LMA Insertion Date/Time: 11/07/2022 7:48 AM  Performed by: Wendi Snipes, CRNAPre-anesthesia Checklist: Patient identified, Emergency Drugs available, Suction available and Patient being monitored Patient Re-evaluated:Patient Re-evaluated prior to induction Oxygen Delivery Method: Circle System Utilized Preoxygenation: Pre-oxygenation with 100% oxygen Induction Type: IV induction Ventilation: Mask ventilation without difficulty LMA: LMA inserted LMA Size: 4.0 Number of attempts: 1 Airway Equipment and Method: Bite block Placement Confirmation: positive ETCO2 Tube secured with: Tape Dental Injury: Teeth and Oropharynx as per pre-operative assessment
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
## 2022-11-08 ENCOUNTER — Encounter (HOSPITAL_COMMUNITY): Payer: Self-pay | Admitting: Student

## 2022-11-08 ENCOUNTER — Inpatient Hospital Stay: Payer: Self-pay

## 2022-11-08 DIAGNOSIS — M86661 Other chronic osteomyelitis, right tibia and fibula: Secondary | ICD-10-CM

## 2022-11-08 DIAGNOSIS — A499 Bacterial infection, unspecified: Secondary | ICD-10-CM

## 2022-11-08 LAB — BASIC METABOLIC PANEL
Anion gap: 10 (ref 5–15)
BUN: 9 mg/dL (ref 6–20)
CO2: 24 mmol/L (ref 22–32)
Calcium: 8.8 mg/dL — ABNORMAL LOW (ref 8.9–10.3)
Chloride: 103 mmol/L (ref 98–111)
Creatinine, Ser: 0.9 mg/dL (ref 0.61–1.24)
GFR, Estimated: >60 mL/min (ref >60)
Glucose, Bld: 95 mg/dL (ref 70–99)
Potassium: 3.9 mmol/L (ref 3.5–5.1)
Sodium: 137 mmol/L (ref 135–145)

## 2022-11-08 LAB — CBC
HCT: 37 % — ABNORMAL LOW (ref 39.0–52.0)
Hemoglobin: 12.3 g/dL — ABNORMAL LOW (ref 13.0–17.0)
MCH: 29.3 pg (ref 26.0–34.0)
MCHC: 33.2 g/dL (ref 30.0–36.0)
MCV: 88.1 fL (ref 80.0–100.0)
Platelets: 263 10*3/uL (ref 150–400)
RBC: 4.2 MIL/uL — ABNORMAL LOW (ref 4.22–5.81)
RDW: 12.6 % (ref 11.5–15.5)
WBC: 9.3 10*3/uL (ref 4.0–10.5)
nRBC: 0 % (ref 0.0–0.2)

## 2022-11-08 LAB — VITAMIN D 25 HYDROXY (VIT D DEFICIENCY, FRACTURES): Vit D, 25-Hydroxy: 18.58 ng/mL — ABNORMAL LOW (ref 30–100)

## 2022-11-08 LAB — TSH: TSH: 1.575 u[IU]/mL (ref 0.350–4.500)

## 2022-11-08 LAB — HEMOGLOBIN A1C
Hgb A1c MFr Bld: 5.6 % (ref 4.8–5.6)
Mean Plasma Glucose: 114 mg/dL

## 2022-11-08 MED ORDER — ORAL CARE MOUTH RINSE: 15.0000 mL | OROMUCOSAL | Status: DC | PRN

## 2022-11-08 MED ORDER — CHLORHEXIDINE GLUCONATE CLOTH 2 % EX PADS
6.0000 | MEDICATED_PAD | Freq: Every day | CUTANEOUS | Status: DC
  Administered 2022-11-09 (×2): 6 via TOPICAL

## 2022-11-08 MED ORDER — SODIUM CHLORIDE 0.9% FLUSH: 10.0000 mL | INTRAVENOUS | Status: DC | PRN

## 2022-11-08 MED ORDER — SODIUM CHLORIDE 0.9% FLUSH
10.0000 mL | Freq: Two times a day (BID) | INTRAVENOUS | Status: DC
  Administered 2022-11-08 – 2022-11-09 (×2): 10 mL

## 2022-11-08 MED ORDER — OXYCODONE HCL 5 MG PO TABS
5.0000 mg | ORAL_TABLET | ORAL | Status: DC | PRN
  Administered 2022-11-08: 5 mg via ORAL
  Filled 2022-11-08: qty 1
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
## 2022-11-08 NOTE — Progress Notes (Signed)
Mobility Specialist - Progress Note   11/08/22 1200  Mobility  Activity Ambulated with assistance in hallway  Level of Assistance Standby assist, set-up cues, supervision of patient - no hands on  Assistive Device Front wheel walker  Distance Ambulated (ft) 280 ft  RLE Weight Bearing NWB  Activity Response Tolerated well  $Mobility charge 1 Mobility    Pt received in recliner eager to participate in mobility. Demonstrated knowledge of RLE NWB and use of RW without fault. Left in recliner w/ call bell in reach and all needs met.   [place] Specialist Please contact via SecureChat or Rehab office at [phone]

## 2022-11-08 NOTE — Progress Notes (Signed)
[facility] for Infectious Disease    Date of Admission:  11/07/2022   Total days of antibiotics 2   ID: Aaron Webster is a 51 y.o. male who is Post-Op day 1 s/p I&D right infected tibial nonunion with removal of all hardware  Principal Problem:   Wound infection complicating hardware ([place])    Subjective: Afebrile. Some pain to medial malleolus to right ankle  Medications:   acetaminophen  1,000 mg Oral Q6H   aspirin EC  81 mg Oral Daily   docusate sodium  100 mg Oral BID    Objective: Vital signs in last 24 hours: Temp:  [97.6 F (36.4 C)-98.7 F (37.1 C)] 97.9 F (36.6 C) (11/30 0805) Pulse Rate:  [57-71] 71 (11/30 0805) Resp:  [13-20] 17 (11/30 0805) BP: (111-136)/(66-85) 115/73 (11/30 0805) SpO2:  [94 %-99 %] 99 % (11/30 0805)  Physical Exam  Constitutional: He is oriented to person, place, and time. He appears well-developed and well-nourished. No distress.  HENT:  Mouth/Throat: Oropharynx is clear and moist. No oropharyngeal exudate.  Cardiovascular: Normal rate, regular rhythm and normal heart sounds. Exam reveals no gallop and no friction rub.  No murmur heard.  Pulmonary/Chest: Effort normal and breath sounds normal. No respiratory distress. He has no wheezes.  Abdominal: Soft. Bowel sounds are normal. He exhibits no distension. There is no tenderness.  Lymphadenopathy:  He has no cervical adenopathy.  Neurological: He is alert and oriented to person, place, and time.  Skin: Skin is warm and dry. No rash noted. No erythema.  Psychiatric: He has a normal mood and affect. His behavior is normal.    Lab Results Recent Labs    11/07/22 1444 11/08/22 0529  WBC  --  9.3  HGB  --  12.3*  HCT  --  37.0*  NA 135 137  K 4.2 3.9  CL 104 103  CO2 23 24  BUN 8 9  CREATININE 0.98 0.90   Lab Results  Component Value Date   ESRSEDRATE 4 10/31/2022   Lab Results  Component Value Date   CRP 0.8 10/31/2022    Microbiology: Culture FEW ESCHERICHIA  COLI   Studies/Results: [place] EKG SITE RITE  Result Date: 11/08/2022 If Site Rite image not attached, placement could not be confirmed due to current cardiac rhythm.  DG Tibia/Fibula Right  Result Date: 11/07/2022 CLINICAL DATA:  Removal of hardware EXAM: RIGHT TIBIA AND FIBULA - 2 VIEW COMPARISON:  06/29/2021 FINDINGS: Multiple intraoperative spot images demonstrate removal of the hardware from the mid to distal right tibia. Fracture line remains evident in the distal tibia. Old healed distal fibular fracture. IMPRESSION: Removal of hardware from the mid to distal right tibia. Fracture lines remain evident. Electronically Signed   By: Rolm Baptise M.D.   On: 11/07/2022 10:36   DG C-Arm 1-60 Min-No Report  Result Date: 11/07/2022 Fluoroscopy was utilized by the requesting physician.  No radiographic interpretation.   DG C-Arm 1-60 Min-No Report  Result Date: 11/07/2022 Fluoroscopy was utilized by the requesting physician.  No radiographic interpretation.   DG C-Arm 1-60 Min-No Report  Result Date: 11/07/2022 Fluoroscopy was utilized by the requesting physician.  No radiographic interpretation.     Assessment/Plan:  Aaron Webster is a 51 y.o. 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 Deep culture showing e.coli  - will narrow abtx to ceftriaxone through tomorrow to see if this will be  the discharge iv abtx vs. Cefazolin. Awaiting sensitivites on e.coli - will get picc line placed today - will also arrange for home health to see patient for iv education and place opat orders in for home health  In terms of follow up   Has follow up appt on 12/27 at 9:30 with stephanie dixon, will transition to oral abtx for remaining part of the course or decide to extend for additional 2 wk depending on healing process  --------------------Diagnosis: Distal tibia osteo  Culture Result: ecoli  Allergies   Allergen Reactions   Codeine Nausea And Vomiting    Not in large doses   Hydrocodone Nausea Only and Other (See Comments)    Hallucinations Not in large doses    OPAT Orders Discharge antibiotics to be given via PICC line Discharge antibiotics: Per pharmacy protocol  ceftriaxone 2gm iv daily vs cefazolin 2gm iv Q 8hr Aim for Vancomycin trough 15-20 or AUC 400-550 (unless otherwise indicated) Duration: 4 wk followed by 2 wk of oral abtx End Date: 12/05/2022  [facility] Care Per Protocol:  Home health RN for IV administration and teaching; PICC line care and labs.    Labs weekly while on IV antibiotics: __x CBC with differential _x_ BMP _x_ CRP _x_ ESR   _x_ Please leave PIC in place until doctor has seen patient or been notified  Fax weekly labs to [phone]  Clinic Follow Up Appt: 12/05/22  @ [facility] for Infectious Diseases Pager: [phone]  11/08/2022, 11:32 AM

## 2022-11-08 NOTE — Progress Notes (Signed)
Peripherally Inserted Central Catheter Placement  The IV Nurse has discussed with the patient and/or persons authorized to consent for the patient, the purpose of this procedure and the potential benefits and risks involved with this procedure.  The benefits include less needle sticks, lab draws from the catheter, and the patient may be discharged home with the catheter. Risks include, but not limited to, infection, bleeding, blood clot (thrombus formation), and puncture of an artery; nerve damage and irregular heartbeat and possibility to perform a PICC exchange if needed/ordered by physician.  Alternatives to this procedure were also discussed.  Bard Power PICC patient education guide, fact sheet on infection prevention and patient information card has been provided to patient /or left at bedside.    PICC Placement Documentation  PICC Single Lumen 11/08/22 Right Basilic 42 cm 0 cm (Active)  Indication for Insertion or Continuance of Line Home intravenous therapies (PICC only) 11/08/22 1824  Exposed Catheter (cm) 0 cm 11/08/22 1824  Site Assessment Clean, Dry, Intact 11/08/22 1824  Line Status Flushed;Saline locked;Blood return noted 11/08/22 1824  Dressing Type Transparent;Securing device 11/08/22 1824  Dressing Status Antimicrobial disc in place;Clean, Dry, Intact 11/08/22 1824  Safety Lock Not Applicable 11/08/22 1824  Line Care Connections checked and tightened 11/08/22 1824  Line Adjustment (NICU/IV Team Only) No 11/08/22 1824  Dressing Intervention New dressing 11/08/22 1824  Dressing Change Due 11/15/22 11/08/22 1824       Aaron Webster  [name] 11/08/2022, 6:28 PM
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
## 2022-11-08 NOTE — Progress Notes (Addendum)
Orthopaedic Trauma Progress Note  SUBJECTIVE: Doing well this morning. Pain controlled. Just needing Tylenol for pain control. Mobilized well with therapies yesterday. No specific concerns of complaints  OBJECTIVE:  Vitals:   11/08/22 0535 11/08/22 0805  BP: 111/67 115/73  Pulse: (!) 57 71  Resp: 20 17  Temp: 98.2 F (36.8 C) 97.9 F (36.6 C)  SpO2: 97% 99%    General: Sitting up in bed, NAD Respiratory: No increased work of breathing.  RLE: Splint CDI. Non-tender above splint. Able to wiggle toes. Endorses sensation over th toes.   IMAGING: All hardware has been removed from right tibia  LABS:  Results for orders placed or performed during the hospital encounter of 11/07/22 (from the past 24 hour(s))  Aerobic/Anaerobic Culture w Gram Stain (surgical/deep wound)     Status: None (Preliminary result)   Collection Time: 11/07/22 10:34 AM   Specimen: PATH Other; Tissue  Result Value Ref Range   Specimen Description TIBIA    Special Requests NONE    Gram Stain      NO WBC SEEN NO ORGANISMS SEEN Performed at [facility], [address] [place], [place] [address]    Culture PENDING    Report Status PENDING   Aerobic/Anaerobic Culture w Gram Stain (surgical/deep wound)     Status: None (Preliminary result)   Collection Time: 11/07/22 10:34 AM   Specimen: PATH Other; Tissue  Result Value Ref Range   Specimen Description TIBIA    Special Requests  NONUNION 2    Gram Stain      RARE WBC PRESENT,BOTH PMN AND MONONUCLEAR NO ORGANISMS SEEN Performed at [facility], [address] [place], [place] [address]    Culture PENDING    Report Status PENDING   Aerobic/Anaerobic Culture w Gram Stain (surgical/deep wound)     Status: None (Preliminary result)   Collection Time: 11/07/22 10:34 AM   Specimen: PATH Other; Tissue  Result Value Ref Range   Specimen Description TIBIA    Special Requests  NONUNION 3    Gram Stain      RARE WBC PRESENT,BOTH PMN AND  MONONUCLEAR NO ORGANISMS SEEN Performed at [facility], [address], [place], [place] [address]    Culture PENDING    Report Status PENDING   Aerobic/Anaerobic Culture w Gram Stain (surgical/deep wound)     Status: None (Preliminary result)   Collection Time: 11/07/22 10:34 AM   Specimen: PATH Other; Tissue  Result Value Ref Range   Specimen Description TIBIA    Special Requests  NONUNION 4    Gram Stain      NO WBC SEEN NO ORGANISMS SEEN Performed at [facility], [address], [place], [place] [address]    Culture PENDING    Report Status PENDING   Aerobic/Anaerobic Culture w Gram Stain (surgical/deep wound)     Status: None (Preliminary result)   Collection Time: 11/07/22 10:34 AM   Specimen: PATH Other; Tissue  Result Value Ref Range   Specimen Description TIBIA    Special Requests  NONUNION 5    Gram Stain      NO WBC SEEN NO ORGANISMS SEEN Performed at [facility], [address], [place], [place] [address]    Culture PENDING    Report Status PENDING   Basic metabolic panel     Status: Abnormal   Collection Time: 11/07/22  2:44 PM  Result Value Ref Range   Sodium 135 135 -  145 mmol/L   Potassium 4.2 3.5 - 5.1 mmol/L   Chloride 104 98 - 111 mmol/L   CO2 23 22 - 32 mmol/L   Glucose, Bld 106 (H) 70 - 99 mg/dL   BUN 8 6 - 20 mg/dL   Creatinine, Ser 0.98 0.61 - 1.24 mg/dL   Calcium 8.2 (L) 8.9 - 10.3 mg/dL   GFR, Estimated >60 >60 mL/min   Anion gap 8 5 - 15  TSH     Status: None   Collection Time: 11/08/22  5:29 AM  Result Value Ref Range   TSH 1.575 0.350 - 4.500 uIU/mL  VITAMIN D 25 Hydroxy (Vit-D Deficiency, Fractures)     Status: Abnormal   Collection Time: 11/08/22  5:29 AM  Result Value Ref Range   Vit D, 25-Hydroxy 18.58 (L) 30 - 100 ng/mL  CBC     Status: Abnormal   Collection Time: 11/08/22  5:29 AM  Result Value Ref Range   WBC 9.3 4.0 - 10.5 K/uL   RBC 4.20 (L) 4.22 - 5.81 MIL/uL   Hemoglobin 12.3 (L) 13.0 - 17.0 g/dL    HCT 37.0 (L) 39.0 - 52.0 %   MCV 88.1 80.0 - 100.0 fL   MCH 29.3 26.0 - 34.0 pg   MCHC 33.2 30.0 - 36.0 g/dL   RDW 12.6 11.5 - 15.5 %   Platelets 263 150 - 400 K/uL   nRBC 0.0 0.0 - 0.2 %  Basic metabolic panel     Status: Abnormal   Collection Time: 11/08/22  5:29 AM  Result Value Ref Range   Sodium 137 135 - 145 mmol/L   Potassium 3.9 3.5 - 5.1 mmol/L   Chloride 103 98 - 111 mmol/L   CO2 24 22 - 32 mmol/L   Glucose, Bld 95 70 - 99 mg/dL   BUN 9 6 - 20 mg/dL   Creatinine, Ser 0.90 0.61 - 1.24 mg/dL   Calcium 8.8 (L) 8.9 - 10.3 mg/dL   GFR, Estimated >60 >60 mL/min   Anion gap 10 5 - 15    ASSESSMENT: Aaron Webster is a 51 y.o. 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 Hemodynamically stable  PLAN: Weightbearing: NWB RLE Incisional and dressing care: Maintain splint Showering: Ok to shower, keep splint dry Orthopedic device(s):  Splint RLE   Pain management:  1. Tylenol 1000 mg q 6 hours scheduled 2. Robaxin 500 mg q 6 hours PRN 3. Oxycodone 5-10 mg q 4 hours PRN VTE prophylaxis: Aspirin 81 mg, SCDs ID:  Ceftriaxone + Vancomycin Foley/Lines:  No foley, KVO IVFs Impediments to Fracture Healing: Open fracture. Vit D level 18, start supplementation Dispo: Therapies as tolerated. Appreciate assistance from ID. Continue to follow cultures. Plan for d/c home tomorrow   Follow - up plan: 2 weeks for wound check, repeat x-rays, suture/splint removal  Contact information:  Katha Hamming MD, Patrecia Pace PA-C. After hours and holidays please check [email or web address] for group call information for [facility]   Breslin Burklow A. Ricci Barker, PA-C [phone] (office) [email or web address]

## 2022-11-08 NOTE — TOC Initial Note (Addendum)
Transition of Care [facility]) - Initial/Assessment Note    Patient Details  Name: Aaron Webster MRN: [id number] Date of Birth: 20-Sep-1971  Transition of Care [facility]) CM/SW Contact:    Kingsley Plan, RN Phone Number: 11/08/2022, 12:44 PM  Clinical Narrative:                  Spoke to patient at bedside. Confirmed address and phone number.   Patient from home with significant other.   Discussed IV antibiotics at home. Explained he will have a HHRN , however HHRN will not be present every time a dose is due.   Prior to discharge Pam with [facility] will teach him and support person how to administer IV antibiotics at home . Patient voiced understanding.   [name] with [facility] does not cover address.   Cory with [facility] unable to accept referral.   [facility] unable to accept referral  [facility] unable to accept referral.   [facility] unable to accept referral.   [facility] unable to accept referral.  Tresa Endo with [facility] unable to accept referral.   Marylene Land with [facility] unable to accept due to staffing.   Calvin with [facility] unable to accept due to staffing   Amy with [facility]  unable to accept referral.    NCM unable to secure [facility]. Pam with [facility] will try [facility]   PT recommendations: Follow physician's recommendations for discharge plan and follow up therapies   NCM secure chatted Sarah PA , await plan. Per Maralyn Sago no PT follow up needed   Patient has walker, knee scooter, and wheelchair at home already     Expected Discharge Plan: Home w Home Health Services Barriers to Discharge: Continued Medical Work up   Patient Goals and CMS Choice Patient states their goals for this hospitalization and ongoing recovery are:: to return to home CMS Medicare.gov Compare Post Acute Care list provided to:: Patient Choice offered to / list presented to : Patient  Expected Discharge Plan and Services Expected Discharge Plan: Home w Home Health Services   Discharge  Planning Services: CM Consult Post Acute Care Choice: Home Health Living arrangements for the past 2 months: Single Family Home                 DME Arranged: N/A         HH Arranged: RN          Prior Living Arrangements/Services Living arrangements for the past 2 months: Single Family Home Lives with:: Significant Other Patient language and need for interpreter reviewed:: Yes Do you feel safe going back to the place where you live?: Yes      Need for Family Participation in Patient Care: Yes (Comment) Care giver support system in place?: Yes (comment) Current home services: DME Criminal Activity/Legal Involvement Pertinent to Current Situation/Hospitalization: No - Comment as needed  Activities of Daily Living Home Assistive Devices/Equipment: Dan Humphreys (specify type) ADL Screening (condition at time of admission) Patient's cognitive ability adequate to safely complete daily activities?: Yes Is the patient deaf or have difficulty hearing?: No Does the patient have difficulty seeing, even when wearing glasses/contacts?: No Does the patient have difficulty concentrating, remembering, or making decisions?: No Patient able to express need for assistance with ADLs?: Yes Does the patient have difficulty dressing or bathing?: No Independently performs ADLs?: Yes (appropriate for developmental age) Does the patient have difficulty walking or climbing stairs?: Yes Weakness of Legs: Right Weakness of Arms/Hands: None  Permission Sought/Granted   Permission granted to  share information with : No              Emotional Assessment Appearance:: Appears stated age Attitude/Demeanor/Rapport: Engaged Affect (typically observed): Accepting Orientation: : Oriented to Self, Oriented to Place, Oriented to  Time, Oriented to Situation Alcohol / Substance Use: Not Applicable Psych Involvement: No (comment)  Admission diagnosis:  Wound infection complicating hardware (HCC)  [V25.7XXA] Patient Active Problem List   Diagnosis Date Noted   Wound infection complicating hardware (HCC) 11/07/2022   Open pilon fracture, right, type III, initial encounter 06/14/2021   S/P small bowel resection 06/02/2021   PCP:  Patient, No Pcp Per Pharmacy:   [facility] [phone] - [place], [place] - [address] AT [facility] [address] [place] [address] Phone: [phone] Fax: [phone]     Social Determinants of Health (SDOH) Interventions    Readmission Risk Interventions    06/06/2021   11:59 AM  Readmission Risk Prevention Plan  Post Dischage Appt Complete  Medication Screening Complete  Transportation Screening Complete

## 2022-11-09 DIAGNOSIS — A499 Bacterial infection, unspecified: Secondary | ICD-10-CM

## 2022-11-09 DIAGNOSIS — M86161 Other acute osteomyelitis, right tibia and fibula: Secondary | ICD-10-CM

## 2022-11-09 DIAGNOSIS — M869 Osteomyelitis, unspecified: Secondary | ICD-10-CM

## 2022-11-09 LAB — AEROBIC/ANAEROBIC CULTURE W GRAM STAIN (SURGICAL/DEEP WOUND): Gram Stain: NONE SEEN

## 2022-11-09 LAB — CBC
HCT: 36.2 % — ABNORMAL LOW (ref 39.0–52.0)
Hemoglobin: 11.8 g/dL — ABNORMAL LOW (ref 13.0–17.0)
MCH: 29.4 pg (ref 26.0–34.0)
MCHC: 32.6 g/dL (ref 30.0–36.0)
MCV: 90 fL (ref 80.0–100.0)
Platelets: 247 10*3/uL (ref 150–400)
RBC: 4.02 MIL/uL — ABNORMAL LOW (ref 4.22–5.81)
RDW: 12.9 % (ref 11.5–15.5)
WBC: 6.8 10*3/uL (ref 4.0–10.5)
nRBC: 0 % (ref 0.0–0.2)

## 2022-11-09 MED ORDER — METHOCARBAMOL 500 MG PO TABS: 500.0000 mg | ORAL_TABLET | Freq: Four times a day (QID) | ORAL | 0 refills | Status: AC | PRN

## 2022-11-09 MED ORDER — VITAMIN D (ERGOCALCIFEROL) 1.25 MG (50000 UNIT) PO CAPS: 50000.0000 [IU] | ORAL_CAPSULE | ORAL | 0 refills | Status: AC

## 2022-11-09 MED ORDER — CEFAZOLIN IV (FOR PTA / DISCHARGE USE ONLY): 2.0000 g | Freq: Three times a day (TID) | INTRAVENOUS | 0 refills | Status: DC

## 2022-11-09 MED ORDER — VITAMIN D (ERGOCALCIFEROL) 1.25 MG (50000 UNIT) PO CAPS
50000.0000 [IU] | ORAL_CAPSULE | ORAL | Status: DC
  Administered 2022-11-09: 50000 [IU] via ORAL
  Filled 2022-11-09: qty 1

## 2022-11-09 MED ORDER — ACETAMINOPHEN 500 MG PO TABS: 1000.0000 mg | ORAL_TABLET | Freq: Four times a day (QID) | ORAL | 0 refills | Status: AC | PRN

## 2022-11-09 MED ORDER — OXYCODONE HCL 5 MG PO TABS: 5.0000 mg | ORAL_TABLET | Freq: Four times a day (QID) | ORAL | 0 refills | Status: AC | PRN

## 2022-11-09 MED ORDER — CEFAZOLIN SODIUM-DEXTROSE 2-4 GM/100ML-% IV SOLN
2.0000 g | Freq: Three times a day (TID) | INTRAVENOUS | Status: DC
  Administered 2022-11-09: 2 g via INTRAVENOUS
  Filled 2022-11-09: qty 100

## 2022-11-09 MED ORDER — HEPARIN SOD (PORK) LOCK FLUSH 100 UNIT/ML IV SOLN
250.0000 [IU] | INTRAVENOUS | Status: AC | PRN
  Administered 2022-11-09: 250 [IU]

## 2022-11-09 MED ORDER — ASPIRIN 81 MG PO TBEC: 81.0000 mg | DELAYED_RELEASE_TABLET | Freq: Every day | ORAL | 0 refills | Status: AC

## 2022-11-09 NOTE — Progress Notes (Signed)
PHARMACY CONSULT NOTE FOR:  OUTPATIENT  PARENTERAL ANTIBIOTIC THERAPY (OPAT)  Indication: Wound infection complicating hardware  Regimen: Cefazolin 2 gm IV Q 8 hours  End date: 12/05/22       Thank you for allowing pharmacy to be a part of this patient's care.  Sharin Mons, PharmD, BCPS, BCIDP Infectious Diseases Clinical Pharmacist Phone: [phone] 11/09/2022, 10:59 AM

## 2022-11-09 NOTE — Progress Notes (Signed)
AVS given and reviewed with pt. Medications discussed. Pt received printed prescription for IV antibiotic. All questions answered to satisfaction. Pt verbalized understanding of information given. Awaiting IV team to heparin lock PICC line prior to discharge.

## 2022-11-09 NOTE — Progress Notes (Addendum)
Orthopaedic Trauma Progress Note  SUBJECTIVE: Doing well this morning. Pain controlled. Just needing Tylenol for pain control. Mobilizing well with therapies. 3/5 intra-op cultures growing E.coli. Received PICC line yesterday  OBJECTIVE:  Vitals:   11/09/22 0449 11/09/22 0741  BP: 107/72 114/75  Pulse: 65 (!) 59  Resp: 18 17  Temp: 98.2 F (36.8 C) 98.2 F (36.8 C)  SpO2: 96% 97%    General: Sitting up in bed, NAD Respiratory: No increased work of breathing.  RLE: Splint CDI. Non-tender above splint. Able to wiggle toes. Endorses sensation over the toes.   IMAGING: All hardware has been removed from right tibia  LABS:  Results for orders placed or performed during the hospital encounter of 11/07/22 (from the past 24 hour(s))  CBC     Status: Abnormal   Collection Time: 11/09/22  3:16 AM  Result Value Ref Range   WBC 6.8 4.0 - 10.5 K/uL   RBC 4.02 (L) 4.22 - 5.81 MIL/uL   Hemoglobin 11.8 (L) 13.0 - 17.0 g/dL   HCT 10.9 (L) 32.3 - 55.7 %   MCV 90.0 80.0 - 100.0 fL   MCH 29.4 26.0 - 34.0 pg   MCHC 32.6 30.0 - 36.0 g/dL   RDW 32.2 02.5 - 42.7 %   Platelets 247 150 - 400 K/uL   nRBC 0.0 0.0 - 0.2 %    ASSESSMENT: Aaron Webster is a 51 y.o. 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 Hemodynamically stable  PLAN: Weightbearing: NWB RLE Incisional and dressing care: Maintain splint Showering: Ok to shower, keep splint dry Orthopedic device(s):  Splint RLE   Pain management:  1. Tylenol 1000 mg q 6 hours scheduled 2. Robaxin 500 mg q 6 hours PRN 3. Oxycodone 5-10 mg q 4 hours PRN VTE prophylaxis: Aspirin 81 mg, SCDs ID:  Ceftriaxone  Foley/Lines:  No foley, KVO IVFs Impediments to Fracture Healing: Open fracture. Vit D level 18, start supplementation Dispo: Therapies as tolerated, no follow-up needed at d/c. Appreciate assistance from ID. Await  sensitivities. Plan for d/c home later today  Follow - up plan: 2 weeks for wound check, repeat x-rays, suture/splint removal  Contact information:  Truitt Merle MD, Ulyses Southward PA-C. After hours and holidays please check [email or web address] for group call information for [facility]   Aaron Magid A. Michaelyn Barter, PA-C [phone] (office) [email or web address]

## 2022-11-09 NOTE — Discharge Instructions (Addendum)
Truitt Merle, MD Ulyses Southward PA-C [facility] [address] [phone] Jani Files)   [phone] (fax)                                  POST-OPERATIVE INSTRUCTIONS     WEIGHT BEARING STATUS: Non-weightbearing right lower extremity  RANGE OF MOTION/ACTIVITY: OK for knee range of motion. Keep splint in place and keep it dry  WOUND CARE Please keep splint clean dry and intact until follow-up. If your splint gets wet for any reason please contact the office immediately.  Do not stick anything down your splint such as pencils, momey, hangers to try and scratch yourself.  If you feel itchy take Benadryl as prescribed on the bottle for itching You may shower on Post-Op Day #2.  You must keep splint dry during this process and may find that a plastic bag taped around the extremity or alternatively a towel based bath may be a better option.   If you get your splint wet or if it is damaged please contact our clinic.  EXERCISES Due to your splint being in place you will not be able to bear weight through your extremity.   DO NOT PUT ANY WEIGHT ON YOUR OPERATIVE LEG Please use crutches or a walker to avoid weight bearing.   DVT/PE prophylaxis: Aspirin 81 mg daily x 30 days  DIET: As you were eating previously.  Can use over the counter stool softeners and bowel preparations, such as Miralax, to help with bowel movements.  Narcotics can be constipating.  Be sure to drink plenty of fluids  REGIONAL ANESTHESIA (NERVE BLOCKS) The anesthesia team may have performed a nerve block for you if safe in the setting of your care.  This is a great tool used to minimize pain.  Typically the block may start wearing off overnight but the long acting medicine may last for 3-4 days.  The nerve block wearing off can be a challenging period but please utilize your as needed pain medications to try and manage this period.    POST-OP MEDICATIONS- Multimodal approach to pain control  In  general your pain will be controlled with a combination of substances.  Prescriptions unless otherwise discussed are electronically sent to your pharmacy.  This is a carefully made plan we use to minimize narcotic use.     - Acetaminophen - Non-narcotic pain medicine taken on a scheduled basis   - Oxycodone - This is a strong narcotic, to be used only on an "as needed" basis for pain.  -  Aspirin 81mg  - This medicine is used to minimize the risk of blood clots after surgery.  FOLLOW-UP If you develop a Fever (>101.5), Redness or Drainage from the surgical incision site, please call our office to arrange for an evaluation. Please call the office to schedule a follow-up appointment for your incision check if you do not already have one, 7-10 days post-operatively.   VISIT OUR WEBSITE FOR ADDITIONAL INFORMATION: [email or web address]   HELPFUL INFORMATION  If you had a block, it will wear off between 8-24 hrs postop typically.  This is period when your pain may go from nearly zero to the pain you would have had postop without the block.  This is an abrupt transition but nothing dangerous is happening.  You may take an extra dose of narcotic when this happens.  You should wean off your narcotic medicines as soon  as you are able.  Most patients will be off or using minimal narcotics before their first postop appointment.   We suggest you use the pain medication the first night prior to going to bed, in order to ease any pain when the anesthesia wears off. You should avoid taking pain medications on an empty stomach as it will make you nauseous.  Do not drink alcoholic beverages or take illicit drugs when taking pain medications.  In most states it is against the law to drive while you are in a splint or sling.  And certainly against the law to drive while taking narcotics.  You may return to work/school in the next couple of days when you feel up to it.   Pain medication may make you constipated.   Below are a few solutions to try in this order: Decrease the amount of pain medication if you aren't having pain. Drink lots of decaffeinated fluids. Drink prune juice and/or each dried prunes  If the first 3 don't work start with additional solutions Take Colace - an over-the-counter stool softener Take Senokot - an over-the-counter laxative Take Miralax - a stronger over-the-counter laxative

## 2022-11-09 NOTE — Progress Notes (Addendum)
Aaron Webster for Infectious Disease  Date of Admission:  11/07/2022      Total days of antibiotics 3           ASSESSMENT: Aaron Webster is a 51 y.o. 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  Majority of the intra-op cultures are growing pansensitive e coli.   Will switch treatment plan to cefazolin IV TID to complete 4 weeks from [facility] removal. Will see him back in clinic to determine appropriateness to switch to oral to finish out tx.   I spoke with Aaron Webster about this at the bedside. Home health has been set up and he is ready for discharge.    PLAN: ADDENDED OPAT below   OPAT ORDERS:  Diagnosis: hardware associated OM   Culture Result: pansensitive E coli   Allergies  Allergen Reactions   Codeine Nausea And Vomiting    Not in large doses   Hydrocodone Nausea Only and Other (See Comments)    Hallucinations Not in large doses     Discharge antibiotics to be given via PICC line:  CEFAZOLIN 2 gm TID IV    Duration: 4 weeks   End Date: 12/05/2022   [facility] Care Per Protocol with [employer] Use: Home health RN for IV administration and teaching, line care and labs.    Labs weekly while on IV antibiotics: _x_ CBC with differential __ BMP **TWICE WEEKLY ON VANCOMYCIN  _x_ CMP _x_ CRP _x_ ESR __ Vancomycin trough TWICE WEEKLY __ CK  __ Please pull PIC at completion of IV antibiotics _x_ Please leave PIC in place until doctor has seen patient or been notified  Fax weekly labs to [phone]  Clinic Follow Up Appt: 12/27@ 9:30 am with Aaron Madeira, NP     Principal Problem:   Wound infection complicating hardware ([place])    acetaminophen  1,000 mg Oral Q6H   aspirin EC  81 mg Oral Daily   Chlorhexidine Gluconate Cloth  6 each Topical Daily   docusate sodium  100 mg Oral BID   sodium chloride flush  10-40 mL Intracatheter Q12H   Vitamin D (Ergocalciferol)  50,000 Units Oral Q7 days     SUBJECTIVE: PICC line in 11/30.   Tibial non-union growing pansensitive E coli    Review of Systems: Review of Systems  All other systems reviewed and are negative.   Allergies  Allergen Reactions   Codeine Nausea And Vomiting    Not in large doses   Hydrocodone Nausea Only and Other (See Comments)    Hallucinations Not in large doses    OBJECTIVE: Vitals:   11/08/22 0805 11/08/22 1958 11/09/22 0449 11/09/22 0741  BP: 115/73 122/73 107/72 114/75  Pulse: 71 60 65 (!) 59  Resp: _0 Temp: 97.9 F (36.6 C) 98.2 F (36.8 C) 98.2 F (36.8 C) 98.2 F (36.8 C)  TempSrc: Oral Oral Oral Oral  SpO2: 99% 97% 96% 97%  Weight:      Height:       Body mass index is 27.75 kg/m.  Physical Exam  Lab Results Lab Results  Component Value Date   WBC 6.8 11/09/2022   HGB 11.8 (L) 11/09/2022   HCT 36.2 (L) 11/09/2022   MCV 90.0 11/09/2022   PLT 247 11/09/2022    Lab Results  Component Value Date   CREATININE 0.90 11/08/2022   BUN 9 11/08/2022   NA 137 11/08/2022  K 3.9 11/08/2022   CL 103 11/08/2022   CO2 24 11/08/2022    Lab Results  Component Value Date   ALT 57 (H) 06/02/2021   AST 86 (H) 06/02/2021   ALKPHOS 57 06/02/2021   BILITOT 0.6 06/02/2021     Microbiology: Recent Results (from the past 240 hour(s))  Aerobic/Anaerobic Culture w Gram Stain (surgical/deep wound)     Status: None (Preliminary result)   Collection Time: 11/07/22 10:34 AM   Specimen: PATH Other; Tissue  Result Value Ref Range Status   Specimen Description TIBIA  Final   Special Requests NONE  Final   Gram Stain   Final    NO WBC SEEN NO ORGANISMS SEEN Performed at [facility], [address], [place], [place] [address]    Culture FEW ESCHERICHIA COLI  Final   Report Status PENDING  Incomplete   Organism ID, Bacteria ESCHERICHIA COLI  Final      Susceptibility   Escherichia coli - MIC*    AMPICILLIN 4 SENSITIVE Sensitive     CEFAZOLIN <=4 SENSITIVE Sensitive      CEFEPIME <=0.12 SENSITIVE Sensitive     CEFTAZIDIME <=1 SENSITIVE Sensitive     CEFTRIAXONE <=0.25 SENSITIVE Sensitive     CIPROFLOXACIN <=0.25 SENSITIVE Sensitive     GENTAMICIN <=1 SENSITIVE Sensitive     IMIPENEM <=0.25 SENSITIVE Sensitive     TRIMETH/SULFA <=20 SENSITIVE Sensitive     AMPICILLIN/SULBACTAM <=2 SENSITIVE Sensitive     PIP/TAZO <=4 SENSITIVE Sensitive     * FEW ESCHERICHIA COLI  Aerobic/Anaerobic Culture w Gram Stain (surgical/deep wound)     Status: None (Preliminary result)   Collection Time: 11/07/22 10:34 AM   Specimen: PATH Other; Tissue  Result Value Ref Range Status   Specimen Description TIBIA  Final   Special Requests  NONUNION 2  Final   Gram Stain   Final    RARE WBC PRESENT,BOTH PMN AND MONONUCLEAR NO ORGANISMS SEEN    Culture FEW ESCHERICHIA COLI  Final   Report Status PENDING  Incomplete   Organism ID, Bacteria ESCHERICHIA COLI  Final      Susceptibility   Escherichia coli - MIC*    AMPICILLIN 4 SENSITIVE Sensitive     CEFAZOLIN <=4 SENSITIVE Sensitive     CEFEPIME <=0.12 SENSITIVE Sensitive     CEFTAZIDIME <=1 SENSITIVE Sensitive     CEFTRIAXONE <=0.25 SENSITIVE Sensitive     CIPROFLOXACIN <=0.25 SENSITIVE Sensitive     GENTAMICIN <=1 SENSITIVE Sensitive     IMIPENEM <=0.25 SENSITIVE Sensitive     TRIMETH/SULFA <=20 SENSITIVE Sensitive     AMPICILLIN/SULBACTAM <=2 SENSITIVE Sensitive     PIP/TAZO Value in next row Sensitive      <=4 SENSITIVEPerformed at [facility], [address], [place], [place] [address]    * FEW ESCHERICHIA COLI  Aerobic/Anaerobic Culture w Gram Stain (surgical/deep wound)     Status: None (Preliminary result)   Collection Time: 11/07/22 10:34 AM   Specimen: PATH Other; Tissue  Result Value Ref Range Status   Specimen Description TIBIA  Final   Special Requests  NONUNION 3  Final   Gram Stain   Final    RARE WBC PRESENT,BOTH PMN AND MONONUCLEAR NO ORGANISMS SEEN    Culture   Final    NO GROWTH 2  DAYS Performed at [facility], [address] [place], [place] [address]    Report Status PENDING  Incomplete  Aerobic/Anaerobic Culture w Gram  Stain (surgical/deep wound)     Status: None (Preliminary result)   Collection Time: 11/07/22 10:34 AM   Specimen: PATH Other; Tissue  Result Value Ref Range Status   Specimen Description TIBIA  Final   Special Requests  NONUNION 4  Final   Gram Stain NO WBC SEEN NO ORGANISMS SEEN   Final   Culture   Final    RARE GRAM NEGATIVE RODS CULTURE REINCUBATED FOR BETTER GROWTH Performed at [facility], [address], [place], [place] [address]    Report Status PENDING  Incomplete  Aerobic/Anaerobic Culture w Gram Stain (surgical/deep wound)     Status: None (Preliminary result)   Collection Time: 11/07/22 10:34 AM   Specimen: PATH Other; Tissue  Result Value Ref Range Status   Specimen Description TIBIA  Final   Special Requests  NONUNION 5  Final   Gram Stain NO WBC SEEN NO ORGANISMS SEEN   Final   Culture   Final    NO GROWTH < 24 HOURS Performed at [facility], [address], [place], [place] [address]    Report Status PENDING  Incomplete    Aaron Madeira, MSN, NP-C [facility] [email or web address] Pager: [phone] Office: [phone] [facility] Main Line: [place] Communication Welcome

## 2022-11-09 NOTE — Progress Notes (Signed)
Mobility Specialist - Progress Note   11/09/22 0957  Mobility  Activity Ambulated with assistance in hallway  Level of Assistance Standby assist, set-up cues, supervision of patient - no hands on  Assistive Device Front wheel walker  Distance Ambulated (ft) 280 ft  RLE Weight Bearing NWB  Activity Response Tolerated well  $Mobility charge 1 Mobility    Pt received in bed agreeable to mobility. No complaints throughout. Left sitting EOB w/ call bell in reach and all needs met.  [place] Specialist Please contact via SecureChat or Rehab office at [phone]

## 2022-11-09 NOTE — Discharge Summary (Signed)
Orthopaedic Trauma Service (OTS) Discharge Summary   Patient ID: Aaron Webster MRN: [id number] DOB/AGE: 01/21/1971 51 y.o.  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 No pertinent past medical history.   Procedures Performed:  Irrigation and debridement of infected right tibial nonunion Removal of hardware right tibia  Discharged Condition: good  Hospital Course: patient presented to [facility] on 11/07/22 for scheduled procedure on right lower extremity.  Patient taken to the operating room by Dr. Doreatha Martin for the above procedure, he tolerated this well without complications.  Multiple intraoperative cultures were obtained.  Patient admitted to orthopedic service for broad-spectrum IV antibiotics while we awaited culture results.  3 out of 5 with the drop in cultures showed growth of E. coli.  Infectious disease was consulted for assistance with discharge antibiotics.  Patient began working with physical and occupational therapy starting on postoperative day #1.  He is started on aspirin for DVT prophylaxis starting on postoperative day #1.  The remainder of patient's hospitalization was dedicated to achieving adequate pain control and increasing mobility as well is waiting for final sensitivities for intraoperative cultures positive for E. Coli. On 11/09/2022, the patient was tolerating diet, working well with therapies, pain well controlled, vital signs stable, dressings clean, dry, intact and felt stable for discharge to home. Patient will follow up as below and knows to call with questions or concerns.     Consults: ID  Significant Diagnostic Studies:   Results for orders placed or performed during the hospital encounter of 11/07/22 (from the past 168  hour(s))  Aerobic/Anaerobic Culture w Gram Stain (surgical/deep wound)   Collection Time: 11/07/22 10:34 AM   Specimen: PATH Other; Tissue  Result Value Ref Range   Specimen Description TIBIA    Special Requests NONE    Gram Stain      NO WBC SEEN NO ORGANISMS SEEN Performed at [facility], [address], [place], [place] [address]    Culture FEW ESCHERICHIA COLI    Report Status PENDING    Organism ID, Bacteria ESCHERICHIA COLI       Susceptibility   Escherichia coli - MIC*    AMPICILLIN 4 SENSITIVE Sensitive     CEFAZOLIN <=4 SENSITIVE Sensitive     CEFEPIME <=0.12 SENSITIVE Sensitive     CEFTAZIDIME <=1 SENSITIVE Sensitive     CEFTRIAXONE <=0.25 SENSITIVE Sensitive     CIPROFLOXACIN <=0.25 SENSITIVE Sensitive     GENTAMICIN <=1 SENSITIVE Sensitive     IMIPENEM <=0.25 SENSITIVE Sensitive     TRIMETH/SULFA <=20 SENSITIVE Sensitive     AMPICILLIN/SULBACTAM <=2 SENSITIVE Sensitive     PIP/TAZO <=4 SENSITIVE Sensitive     * FEW ESCHERICHIA COLI  Aerobic/Anaerobic Culture w Gram Stain (surgical/deep wound)   Collection Time: 11/07/22 10:34 AM   Specimen: PATH Other; Tissue  Result Value Ref Range   Specimen Description TIBIA    Special Requests  NONUNION 2    Gram Stain      RARE WBC PRESENT,BOTH PMN AND MONONUCLEAR NO ORGANISMS SEEN    Culture FEW ESCHERICHIA COLI    Report Status PENDING    Organism ID, Bacteria ESCHERICHIA COLI       Susceptibility   Escherichia coli - MIC*    AMPICILLIN 4 SENSITIVE Sensitive     CEFAZOLIN <=4 SENSITIVE Sensitive  CEFEPIME <=0.12 SENSITIVE Sensitive     CEFTAZIDIME <=1 SENSITIVE Sensitive     CEFTRIAXONE <=0.25 SENSITIVE Sensitive     CIPROFLOXACIN <=0.25 SENSITIVE Sensitive     GENTAMICIN <=1 SENSITIVE Sensitive     IMIPENEM <=0.25 SENSITIVE Sensitive     TRIMETH/SULFA <=20 SENSITIVE Sensitive     AMPICILLIN/SULBACTAM <=2 SENSITIVE Sensitive     PIP/TAZO Value in next row Sensitive      <=4 SENSITIVEPerformed at  [facility] [address] [place], [place] [address]    * FEW ESCHERICHIA COLI  Aerobic/Anaerobic Culture w Gram Stain (surgical/deep wound)   Collection Time: 11/07/22 10:34 AM   Specimen: PATH Other; Tissue  Result Value Ref Range   Specimen Description TIBIA    Special Requests  NONUNION 3    Gram Stain      RARE WBC PRESENT,BOTH PMN AND MONONUCLEAR NO ORGANISMS SEEN    Culture      NO GROWTH 2 DAYS Performed at [facility], [address] [place], [place] [address]    Report Status PENDING   Aerobic/Anaerobic Culture w Gram Stain (surgical/deep wound)   Collection Time: 11/07/22 10:34 AM   Specimen: PATH Other; Tissue  Result Value Ref Range   Specimen Description TIBIA    Special Requests  NONUNION 4    Gram Stain NO WBC SEEN NO ORGANISMS SEEN     Culture      RARE GRAM NEGATIVE RODS CULTURE REINCUBATED FOR BETTER GROWTH Performed at [facility], [place] [address], [place], [place] [address]    Report Status PENDING   Aerobic/Anaerobic Culture w Gram Stain (surgical/deep wound)   Collection Time: 11/07/22 10:34 AM   Specimen: PATH Other; Tissue  Result Value Ref Range   Specimen Description TIBIA    Special Requests  NONUNION 5    Gram Stain NO WBC SEEN NO ORGANISMS SEEN     Culture      NO GROWTH < 24 HOURS Performed at [facility], [place] [address] [place], [place] [address]    Report Status PENDING   Basic metabolic panel   Collection Time: 11/07/22  2:44 PM  Result Value Ref Range   Sodium 135 135 - 145 mmol/L   Potassium 4.2 3.5 - 5.1 mmol/L   Chloride 104 98 - 111 mmol/L   CO2 23 22 - 32 mmol/L   Glucose, Bld 106 (H) 70 - 99 mg/dL   BUN 8 6 - 20 mg/dL   Creatinine, Ser 0.98 0.61 - 1.24 mg/dL   Calcium 8.2 (L) 8.9 - 10.3 mg/dL   GFR, Estimated >60 >60 mL/min   Anion gap 8 5 - 15  TSH   Collection Time: 11/08/22  5:29 AM  Result Value Ref Range   TSH 1.575 0.350 - 4.500 uIU/mL  VITAMIN D 25 Hydroxy (Vit-D Deficiency,  Fractures)   Collection Time: 11/08/22  5:29 AM  Result Value Ref Range   Vit D, 25-Hydroxy 18.58 (L) 30 - 100 ng/mL  Hemoglobin A1c   Collection Time: 11/08/22  5:29 AM  Result Value Ref Range   Hgb A1c MFr Bld 5.6 4.8 - 5.6 %   Mean Plasma Glucose 114 mg/dL  CBC   Collection Time: 11/08/22  5:29 AM  Result Value Ref Range   WBC 9.3 4.0 - 10.5 K/uL   RBC 4.20 (L) 4.22 - 5.81 MIL/uL   Hemoglobin 12.3 (L) 13.0 - 17.0 g/dL   HCT 37.0 (L) 39.0 -  52.0 %   MCV 88.1 80.0 - 100.0 fL   MCH 29.3 26.0 - 34.0 pg   MCHC 33.2 30.0 - 36.0 g/dL   RDW 12.6 11.5 - 15.5 %   Platelets 263 150 - 400 K/uL   nRBC 0.0 0.0 - 0.2 %  Basic metabolic panel   Collection Time: 11/08/22  5:29 AM  Result Value Ref Range   Sodium 137 135 - 145 mmol/L   Potassium 3.9 3.5 - 5.1 mmol/L   Chloride 103 98 - 111 mmol/L   CO2 24 22 - 32 mmol/L   Glucose, Bld 95 70 - 99 mg/dL   BUN 9 6 - 20 mg/dL   Creatinine, Ser 0.90 0.61 - 1.24 mg/dL   Calcium 8.8 (L) 8.9 - 10.3 mg/dL   GFR, Estimated >60 >60 mL/min   Anion gap 10 5 - 15  CBC   Collection Time: 11/09/22  3:16 AM  Result Value Ref Range   WBC 6.8 4.0 - 10.5 K/uL   RBC 4.02 (L) 4.22 - 5.81 MIL/uL   Hemoglobin 11.8 (L) 13.0 - 17.0 g/dL   HCT 36.2 (L) 39.0 - 52.0 %   MCV 90.0 80.0 - 100.0 fL   MCH 29.4 26.0 - 34.0 pg   MCHC 32.6 30.0 - 36.0 g/dL   RDW 12.9 11.5 - 15.5 %   Platelets 247 150 - 400 K/uL   nRBC 0.0 0.0 - 0.2 %     Treatments: IV hydration, antibiotics: Ancef, vancomycin, and ceftriaxone, analgesia: acetaminophen, Dilaudid, and oxycodone, anticoagulation: ASA, therapies: PT and OT, and surgery: as above  Discharge Exam: General: Sitting up in bed, NAD Respiratory: No increased work of breathing.  RLE: Splint CDI. Non-tender above splint. Able to wiggle toes. Endorses sensation over the toes.   Disposition: Discharge disposition: 01-Home or Self Care       Discharge Instructions     [facility] pharmacist to adjust  dose for Vancomycin, Aminoglycosides and other anti-infective therapies as requested by physician.   Complete by: As directed    [facility] to provide Cath Flo 57m   Complete by: As directed    Administer for PICC line occlusion and as ordered by physician for other access device issues.   Anaphylaxis Kit: Provided to treat any anaphylactic reaction to the medication being provided to the patient if First Dose or when requested by physician   Complete by: As directed    Epinephrine 1372mml vial / amp: Administer 0.72m65m0.72ml18mubcutaneously once for moderate to severe anaphylaxis, nurse to call physician and pharmacy when reaction occurs and call 911 if needed for immediate care   Diphenhydramine 50mg28mIV vial: Administer 25-50mg 82mM PRN for first dose reaction, rash, itching, mild reaction, nurse to call physician and pharmacy when reaction occurs   Sodium Chloride 0.9% NS 500ml I24mdminister if needed for hypovolemic blood pressure drop or as ordered by physician after call to physician with anaphylactic reaction   Call MD / Call 911   Complete by: As directed    If you experience chest pain or shortness of breath, CALL 911 and be transported to the hospital emergency room.  If you develope a fever above 101 F, pus (white drainage) or increased drainage or redness at the wound, or calf pain, call your surgeon's office.   Change dressing on IV access line weekly and PRN   Complete by: As directed    Constipation Prevention   Complete by: As directed  Drink plenty of fluids.  Prune juice may be helpful.  You may use a stool softener, such as Colace (over the counter) 100 mg twice a day.  Use MiraLax (over the counter) for constipation as needed.   Diet - low sodium heart healthy   Complete by: As directed    Flush IV access with Sodium Chloride 0.9% and Heparin 10 units/ml or 100 units/ml   Complete by: As directed    Home infusion instructions - [facility]    Complete by: As directed    Instructions: Flush IV access with Sodium Chloride 0.9% and Heparin 10units/ml or 100units/ml   Change dressing on IV access line: Weekly and PRN   Instructions Cath Flo 50m: Administer for PICC Line occlusion and as ordered by physician for other access device   [facility] pharmacist to adjust dose for: Vancomycin, Aminoglycosides and other anti-infective therapies as requested by physician   Increase activity slowly as tolerated   Complete by: As directed    Method of administration may be changed at the discretion of home infusion pharmacist based upon assessment of the patient and/or caregiver's ability to self-administer the medication ordered   Complete by: As directed    Post-operative opioid taper instructions:   Complete by: As directed    POST-OPERATIVE OPIOID TAPER INSTRUCTIONS: It is important to wean off of your opioid medication as soon as possible. If you do not need pain medication after your surgery it is ok to stop day one. Opioids include: Codeine, Hydrocodone(Norco, Vicodin), Oxycodone(Percocet, oxycontin) and hydromorphone amongst others.  Long term and even short term use of opiods can cause: Increased pain response Dependence Constipation Depression Respiratory depression And more.  Withdrawal symptoms can include Flu like symptoms Nausea, vomiting And more Techniques to manage these symptoms Hydrate well Eat regular healthy meals Stay active Use relaxation techniques(deep breathing, meditating, yoga) Do Not substitute Alcohol to help with tapering If you have been on opioids for less than two weeks and do not have pain than it is ok to stop all together.  Plan to wean off of opioids This plan should start within one week post op of your joint replacement. Maintain the same interval or time between taking each dose and first decrease the dose.  Cut the total daily intake of opioids by one tablet each day Next start  to increase the time between doses. The last dose that should be eliminated is the evening dose.         Allergies as of 11/09/2022       Reactions   Codeine Nausea And Vomiting   Not in large doses   Hydrocodone Nausea Only, Other (See Comments)   Hallucinations Not in large doses        Medication List     TAKE these medications    acetaminophen 500 MG tablet Commonly known as: TYLENOL Take 2 tablets (1,000 mg total) by mouth every 6 (six) hours as needed for moderate pain or mild pain.   aspirin EC 81 MG tablet Take 1 tablet (81 mg total) by mouth daily. Swallow whole.   ceFAZolin  IVPB Commonly known as: ANCEF Inject 2 g into the vein every 8 (eight) hours for 26 days. Indication:  Wound infection complicating hardware  First Dose: Yes Last Day of Therapy: 12/05/22  Labs - Once weekly:  CBC/D and BMP, Labs - Every other week:  ESR and CRP Method of administration: IV Push Method of administration may be changed  at the discretion of home infusion pharmacist based upon assessment of the patient and/or caregiver's ability to self-administer the medication ordered.   methocarbamol 500 MG tablet Commonly known as: ROBAXIN Take 1 tablet (500 mg total) by mouth every 6 (six) hours as needed for muscle spasms.   oxyCODONE 5 MG immediate release tablet Commonly known as: Oxy IR/ROXICODONE Take 1 tablet (5 mg total) by mouth every 6 (six) hours as needed for severe pain.   Vitamin D (Ergocalciferol) 1.25 MG (50000 UNIT) Caps capsule Commonly known as: DRISDOL Take 1 capsule (50,000 Units total) by mouth every 7 (seven) days.               Discharge Care Instructions  (From admission, onward)           Start     Ordered   11/09/22 0000  Change dressing on IV access line weekly and PRN  (Home infusion instructions - [facility] )        11/09/22 1105            Follow-up [facility] for Infectious Disease  Follow up on 12/05/2022.   Specialty: Infectious Diseases Why: Hospital Discharge Follow Up 9:30 am. Please arrive 15 min early to register. Contact information: [place], [place]        Haddix, Thomasene Lot, MD. Schedule an appointment as soon as possible for a visit in 2 week(s).   Specialty: Orthopedic Surgery Why: for wound check, repeat x-rasy, splint removal and transition to cast Contact information: [place] [address] [phone]                 Discharge Instructions and Plan: Patient will be discharged to home. Will be discharged on Aspirin for DVT prophylaxis. Will continue on IV antibiotics for 4 weeks. Patient has all the necessary DME for discharge. Patient will follow up with Dr. Doreatha Martin in 2 weeks for repeat x-rays and suture removal. He has follow-up arranged with infectious disease in 4 weeks   Signed:  Gwinda Passe, PA-C ?([phone]? (phone) 11/09/2022, 12:00 PM  [facility] [place] [address] [phone] Domingo Sep (F)

## 2022-11-11 LAB — AEROBIC/ANAEROBIC CULTURE W GRAM STAIN (SURGICAL/DEEP WOUND): Gram Stain: NONE SEEN

## 2022-11-12 LAB — AEROBIC/ANAEROBIC CULTURE W GRAM STAIN (SURGICAL/DEEP WOUND)

## 2022-11-13 LAB — AEROBIC/ANAEROBIC CULTURE W GRAM STAIN (SURGICAL/DEEP WOUND)
Culture: NEGATIVE
Gram Stain: NONE SEEN

## 2022-12-05 ENCOUNTER — Other Ambulatory Visit: Payer: Self-pay

## 2022-12-05 ENCOUNTER — Ambulatory Visit (INDEPENDENT_AMBULATORY_CARE_PROVIDER_SITE_OTHER): Payer: Commercial Managed Care - PPO | Admitting: Infectious Diseases

## 2022-12-05 ENCOUNTER — Encounter: Payer: Self-pay | Admitting: Infectious Diseases

## 2022-12-05 VITALS — BP 118/79 | HR 73 | Temp 97.0°F

## 2022-12-05 DIAGNOSIS — M86161 Other acute osteomyelitis, right tibia and fibula: Secondary | ICD-10-CM

## 2022-12-05 DIAGNOSIS — Z452 Encounter for adjustment and management of vascular access device: Secondary | ICD-10-CM | POA: Diagnosis not present

## 2022-12-05 DIAGNOSIS — Z5181 Encounter for therapeutic drug level monitoring: Secondary | ICD-10-CM

## 2022-12-05 MED ORDER — LEVOFLOXACIN 750 MG PO TABS: 750.0000 mg | ORAL_TABLET | Freq: Every day | ORAL | 0 refills | Status: DC

## 2022-12-05 NOTE — Assessment & Plan Note (Addendum)
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  CRP just slightly elevated at 1.2 mg/dL on 01/77 - did not run one last time. Will check today to establish trend.  His incision is healing and without signs of local infection; no pain in the leg with non-weight bearing activities.  Will D/C PICC line and have him start high dose Levaquin 750 mg QD for another 2 weeks to continue treatment pending re-evaluation of his x-ray for healing follow up in 2-3 weeks with Dr. Drue Second or myself.

## 2022-12-05 NOTE — Progress Notes (Addendum)
Labs drawn via PICC line per Rexene Alberts, NP. Line flushed with 10 mL normal saline and clamped. Patient tolerated procedure well.    PICC Removal    PICC length & location:  right basilic 42 cm Removed per verbal order from: Rexene Alberts, NP  Blood thinners:  aspirin 81 mg Platelet count:  290 (per [facility] 11/30/22)  Site assessment: Dressing clean and dry. Extremity warm and dry with palpable radial pulse. No drainage or swelling present at insertion site. Slight erythema present around insertion site, about 0.5 cm in diameter. Advised patient to monitor this site and notify our office if redness spreads, develops drainage, swelling, streaking, or fevers. Provider notified.   Pre-removal vital signs:  BP:  124/79 HR:  80 SpO2:  97% room air  No sutures present. Insertion site cleaned with CHG, catheter removed and petroleum dressing applied. Tip intact. Pressure held until hemostasis achieved.    Length of catheter removed:  42 cm   Provided patient with after care instructions and precautions print out (via [employer]). Reviewed this information with patient.   Patient verbalized understanding and agreement, all questions answered. Patient tolerated procedure well and remained in clinic under the care of RN 30 minutes post removal.  Post-observation vital signs:  BP:  118 /79 HR:  73 SpO2:  98% room air  Notified Jeri Modena, RN with [facility] and [facility] pharmacy team of removal.  Sandie Ano, RN

## 2022-12-05 NOTE — Assessment & Plan Note (Addendum)
All OPAT lab work reviewed over the last 4 weeks. Creatinine was mildly elevated 1.36 with Na+ 133 c/w dehydration. These resolved as of 12/20 with Cr 0.87  Based on his description of LLQ abdominal pain and features he experienced, I suspect he has some constipation that could have been triggered by dehydration or s/e of cefazolin.   Medication counseling provided re: Levaquin to avoid taking with Dairy products or multivitamins to ensure maximally absorbed.

## 2022-12-05 NOTE — Assessment & Plan Note (Addendum)
Site with a little irritation/redness but no drainage or tenderness.  This was removed today in clinic with 30 min observation.  Precautions provided for care following removal.

## 2022-12-05 NOTE — Patient Instructions (Addendum)
Nice to see you today  STOP the IV antibiotics.   Will plan to switch you over to a pill to finish out another 2 weeks of treatment - LEVAQUIN is the antibiotic we will use to finish.   Take 1 tablet once a day. In the morning is fine to take. Just avoid taking it with any dairy products of multivitamins.   Will see you back in 2-3 weeks with Dr. Drue Second or myself to see how things are going. We can make this a video visit as well where you can upload photos ahead of time of your leg to your MyChart.   Or if you prefer to come in person that's great too.
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
## 2022-12-06 LAB — C-REACTIVE PROTEIN: CRP: 21.9 mg/L — ABNORMAL HIGH (ref <8.0)

## 2022-12-18 ENCOUNTER — Telehealth: Payer: Self-pay

## 2022-12-18 DIAGNOSIS — M86161 Other acute osteomyelitis, right tibia and fibula: Secondary | ICD-10-CM

## 2022-12-18 MED ORDER — LEVOFLOXACIN 750 MG PO TABS: 750.0000 mg | ORAL_TABLET | Freq: Every day | ORAL | 0 refills | Status: AC

## 2022-12-18 NOTE — Addendum Note (Signed)
Addended by: Idylwood Callas on: 12/18/2022 09:25 AM   Modules accepted: Orders

## 2022-12-18 NOTE — Telephone Encounter (Signed)
Patient is aware of refill sent in to [facility] on [address] and will discuss further refills after patient sees providers.

## 2022-12-18 NOTE — Telephone Encounter (Signed)
Will send in another 10d supply until I see him back on the 19th - we can determine after he seems me and Dr. Doreatha Martin if he needs further antibiotics. Hopefully he will be done at that point.

## 2022-12-18 NOTE — Telephone Encounter (Signed)
Patient called asking for a med refill on antibiotics. He had an appointment with Dr Doreatha Martin today but rescheduled it to 1/22.

## 2022-12-27 ENCOUNTER — Other Ambulatory Visit: Payer: Self-pay

## 2022-12-27 ENCOUNTER — Ambulatory Visit (INDEPENDENT_AMBULATORY_CARE_PROVIDER_SITE_OTHER): Payer: Commercial Managed Care - PPO | Admitting: Infectious Diseases

## 2022-12-27 ENCOUNTER — Encounter: Payer: Self-pay | Admitting: Infectious Diseases

## 2022-12-27 VITALS — BP 134/78 | HR 75 | Temp 98.0°F | Resp 16 | Wt 223.0 lb

## 2022-12-27 DIAGNOSIS — M86161 Other acute osteomyelitis, right tibia and fibula: Secondary | ICD-10-CM | POA: Diagnosis not present

## 2022-12-27 DIAGNOSIS — M86461 Chronic osteomyelitis with draining sinus, right tibia and fibula: Secondary | ICD-10-CM | POA: Diagnosis not present
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
## 2022-12-27 NOTE — Patient Instructions (Addendum)
Nice to see you -   Will touch base with Dr. Doreatha Martin but I feel pretty good that you will not need any more antibiotics after this last prescription

## 2022-12-28 ENCOUNTER — Ambulatory Visit: Payer: Commercial Managed Care - PPO | Admitting: Infectious Diseases

## 2022-12-28 LAB — SEDIMENTATION RATE: Sed Rate: 2 mm/h (ref 0–20)

## 2022-12-28 LAB — C-REACTIVE PROTEIN: CRP: 2.5 mg/L (ref <8.0)

## 2022-12-28 NOTE — Assessment & Plan Note (Signed)
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  After removal of his hardware on November 29 he completed 4 weeks of IV cefazolin to target pansensitive E. coli and then was transition to oral Levaquin 750 once a day for 3 more weeks to finish out treatment.  On exam today while he still has some fluctuating swelling (which is likely going to be persistent and chronic), his incision is healing and dry.  There is no warmth or tenderness.  He does have some hyperemic skin discoloration noted consistent with venous stasis and chronic swelling.  Will check CRP and ESR today.  He can finish out his current bottle of Levaquin and then observe off antibiotics.  He has a follow-up with Dr. Doreatha Martin next week to reassess for bone healing.  He can follow-up with [place] as needed if Dr. Doreatha Martin has any concerns going forward.
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
## 2023-09-12 ENCOUNTER — Other Ambulatory Visit: Payer: Self-pay | Admitting: Student

## 2023-09-12 DIAGNOSIS — T847XXD Infection and inflammatory reaction due to other internal orthopedic prosthetic devices, implants and grafts, subsequent encounter: Secondary | ICD-10-CM

## 2023-09-12 DIAGNOSIS — S82871F Displaced pilon fracture of right tibia, subsequent encounter for open fracture type IIIA, IIIB, or IIIC with routine healing: Secondary | ICD-10-CM

## 2023-09-17 ENCOUNTER — Other Ambulatory Visit: Payer: Self-pay | Admitting: Student

## 2023-09-17 ENCOUNTER — Ambulatory Visit
Admission: RE | Admit: 2023-09-17 | Discharge: 2023-09-17 | Disposition: A | Discharge status: Home / Self Care | Payer: Commercial Managed Care - PPO | Source: Ambulatory Visit | Attending: Student | Admitting: Student

## 2023-09-17 DIAGNOSIS — T847XXD Infection and inflammatory reaction due to other internal orthopedic prosthetic devices, implants and grafts, subsequent encounter: Secondary | ICD-10-CM

## 2023-09-17 DIAGNOSIS — S82871F Displaced pilon fracture of right tibia, subsequent encounter for open fracture type IIIA, IIIB, or IIIC with routine healing: Secondary | ICD-10-CM
